# Patient Record
Sex: Female | Born: 1945 | Race: White | Hispanic: No | Marital: Married | Smoking: Former smoker
Health system: Southern US, Community
[De-identification: ages and names within clinical notes are randomized; demographics above are authoritative.]

## PROBLEM LIST (undated history)

## (undated) DIAGNOSIS — E7800 Pure hypercholesterolemia, unspecified: Secondary | ICD-10-CM

## (undated) DIAGNOSIS — Z973 Presence of spectacles and contact lenses: Secondary | ICD-10-CM

## (undated) DIAGNOSIS — I493 Ventricular premature depolarization: Secondary | ICD-10-CM

## (undated) DIAGNOSIS — M1990 Unspecified osteoarthritis, unspecified site: Secondary | ICD-10-CM

## (undated) DIAGNOSIS — I10 Essential (primary) hypertension: Secondary | ICD-10-CM

## (undated) DIAGNOSIS — R011 Cardiac murmur, unspecified: Secondary | ICD-10-CM

## (undated) DIAGNOSIS — C801 Malignant (primary) neoplasm, unspecified: Secondary | ICD-10-CM

## (undated) DIAGNOSIS — R112 Nausea with vomiting, unspecified: Secondary | ICD-10-CM

## (undated) DIAGNOSIS — E119 Type 2 diabetes mellitus without complications: Secondary | ICD-10-CM

## (undated) DIAGNOSIS — Z972 Presence of dental prosthetic device (complete) (partial): Secondary | ICD-10-CM

## (undated) DIAGNOSIS — H409 Unspecified glaucoma: Secondary | ICD-10-CM

## (undated) DIAGNOSIS — Z9889 Other specified postprocedural states: Secondary | ICD-10-CM

## (undated) DIAGNOSIS — E039 Hypothyroidism, unspecified: Secondary | ICD-10-CM

## (undated) HISTORY — PX: TONSILLECTOMY: SUR1361

## (undated) HISTORY — PX: APPENDECTOMY: SHX54

## (undated) HISTORY — PX: LAMINECTOMY: SHX219

## (undated) HISTORY — PX: CHOLECYSTECTOMY: SHX55

## (undated) HISTORY — PX: OTHER SURGICAL HISTORY: SHX169

## (undated) HISTORY — DX: Hypothyroidism, unspecified: E03.9

## (undated) HISTORY — PX: EYE SURGERY: SHX253

---

## 2000-11-30 ENCOUNTER — Encounter: Admission: RE | Admit: 2000-11-30 | Discharge: 2001-02-28 | Payer: Self-pay | Admitting: Family Medicine

## 2005-02-06 ENCOUNTER — Ambulatory Visit (HOSPITAL_COMMUNITY): Admission: RE | Admit: 2005-02-06 | Discharge: 2005-02-06 | Payer: Self-pay | Admitting: Family Medicine

## 2018-01-15 ENCOUNTER — Other Ambulatory Visit: Payer: Self-pay

## 2018-01-15 ENCOUNTER — Emergency Department (HOSPITAL_BASED_OUTPATIENT_CLINIC_OR_DEPARTMENT_OTHER)
Admission: EM | Admit: 2018-01-15 | Discharge: 2018-01-15 | Disposition: A | Discharge status: Home / Self Care | Payer: Medicare Other | Attending: Emergency Medicine | Admitting: Emergency Medicine

## 2018-01-15 ENCOUNTER — Emergency Department (HOSPITAL_BASED_OUTPATIENT_CLINIC_OR_DEPARTMENT_OTHER): Payer: Medicare Other

## 2018-01-15 ENCOUNTER — Encounter (HOSPITAL_BASED_OUTPATIENT_CLINIC_OR_DEPARTMENT_OTHER): Payer: Self-pay | Admitting: Emergency Medicine

## 2018-01-15 DIAGNOSIS — X509XXA Other and unspecified overexertion or strenuous movements or postures, initial encounter: Secondary | ICD-10-CM | POA: Insufficient documentation

## 2018-01-15 DIAGNOSIS — E119 Type 2 diabetes mellitus without complications: Secondary | ICD-10-CM | POA: Diagnosis not present

## 2018-01-15 DIAGNOSIS — Z79899 Other long term (current) drug therapy: Secondary | ICD-10-CM | POA: Diagnosis not present

## 2018-01-15 DIAGNOSIS — Z794 Long term (current) use of insulin: Secondary | ICD-10-CM | POA: Diagnosis not present

## 2018-01-15 DIAGNOSIS — Y9389 Activity, other specified: Secondary | ICD-10-CM | POA: Insufficient documentation

## 2018-01-15 DIAGNOSIS — Y9289 Other specified places as the place of occurrence of the external cause: Secondary | ICD-10-CM | POA: Diagnosis not present

## 2018-01-15 DIAGNOSIS — S60031A Contusion of right middle finger without damage to nail, initial encounter: Secondary | ICD-10-CM | POA: Diagnosis not present

## 2018-01-15 DIAGNOSIS — S6991XA Unspecified injury of right wrist, hand and finger(s), initial encounter: Secondary | ICD-10-CM | POA: Diagnosis present

## 2018-01-15 DIAGNOSIS — Y999 Unspecified external cause status: Secondary | ICD-10-CM | POA: Insufficient documentation

## 2018-01-15 DIAGNOSIS — I10 Essential (primary) hypertension: Secondary | ICD-10-CM | POA: Diagnosis not present

## 2018-01-15 HISTORY — DX: Essential (primary) hypertension: I10

## 2018-01-15 HISTORY — DX: Pure hypercholesterolemia, unspecified: E78.00

## 2018-01-15 HISTORY — DX: Type 2 diabetes mellitus without complications: E11.9

## 2018-01-15 IMAGING — CR DG HAND COMPLETE 3+V*R*
3 series · 3 of 3 positions shown · non-contrast
Comparison: None.

CLINICAL DATA: Hand injury, pain at third MCP joint, bruising and
swelling.

EXAM:
RIGHT HAND - COMPLETE 3+ VIEW

[x hand pa right]
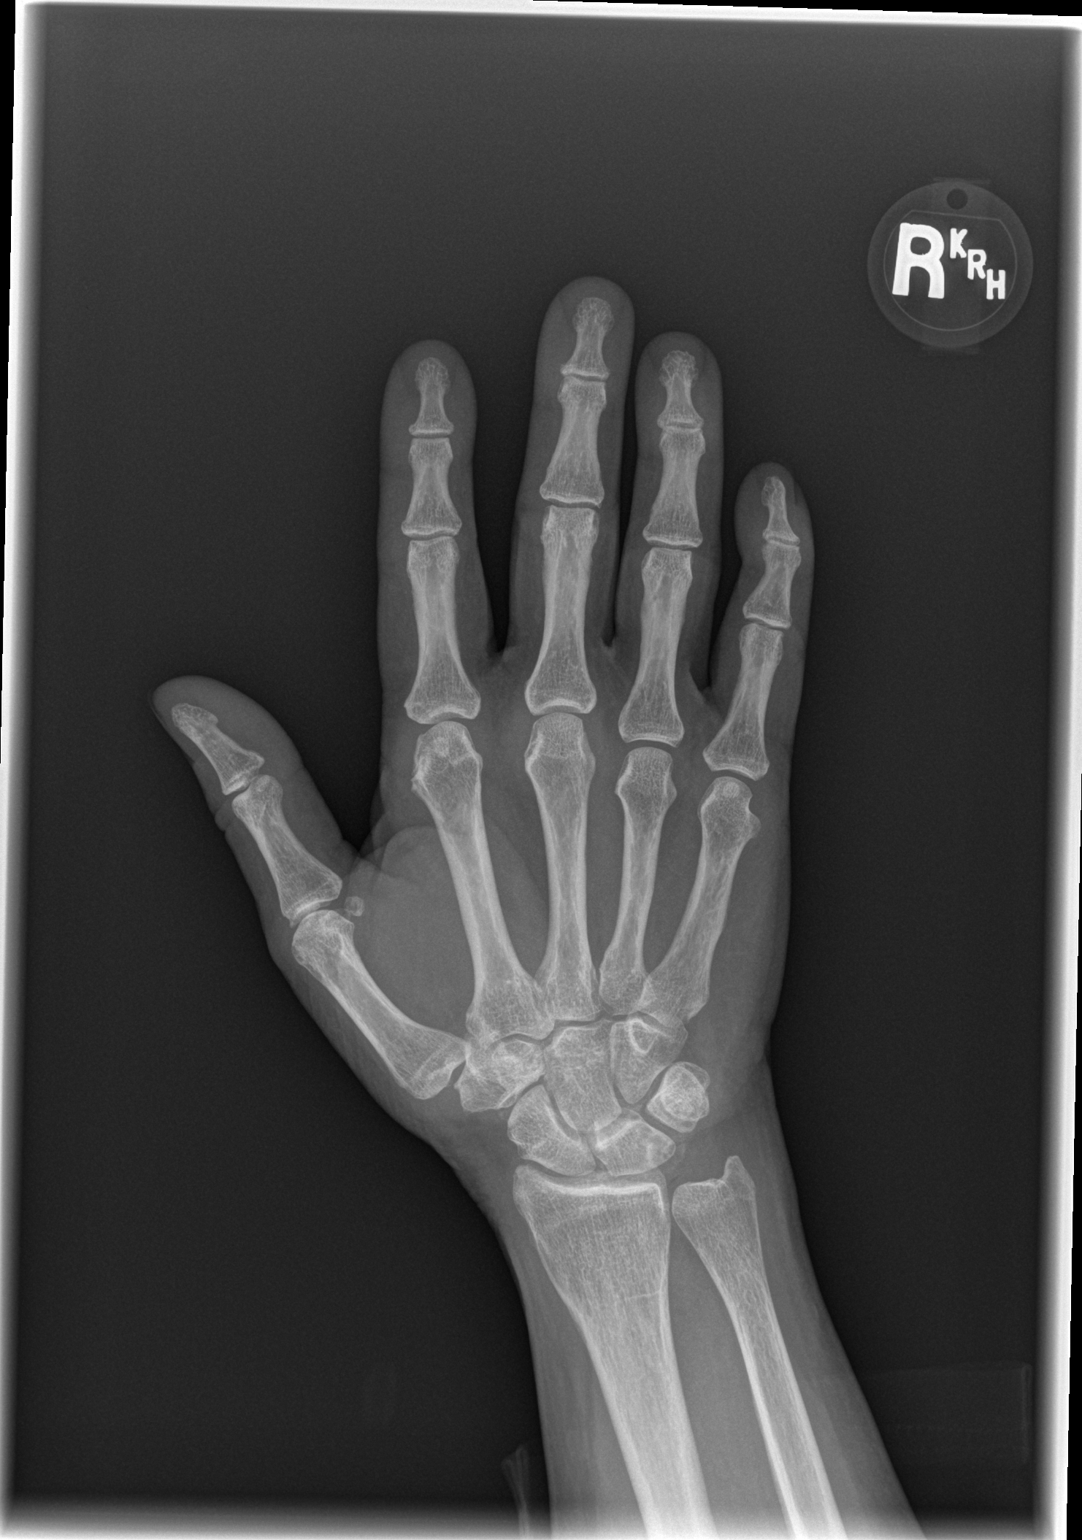

[x hand oblique right]
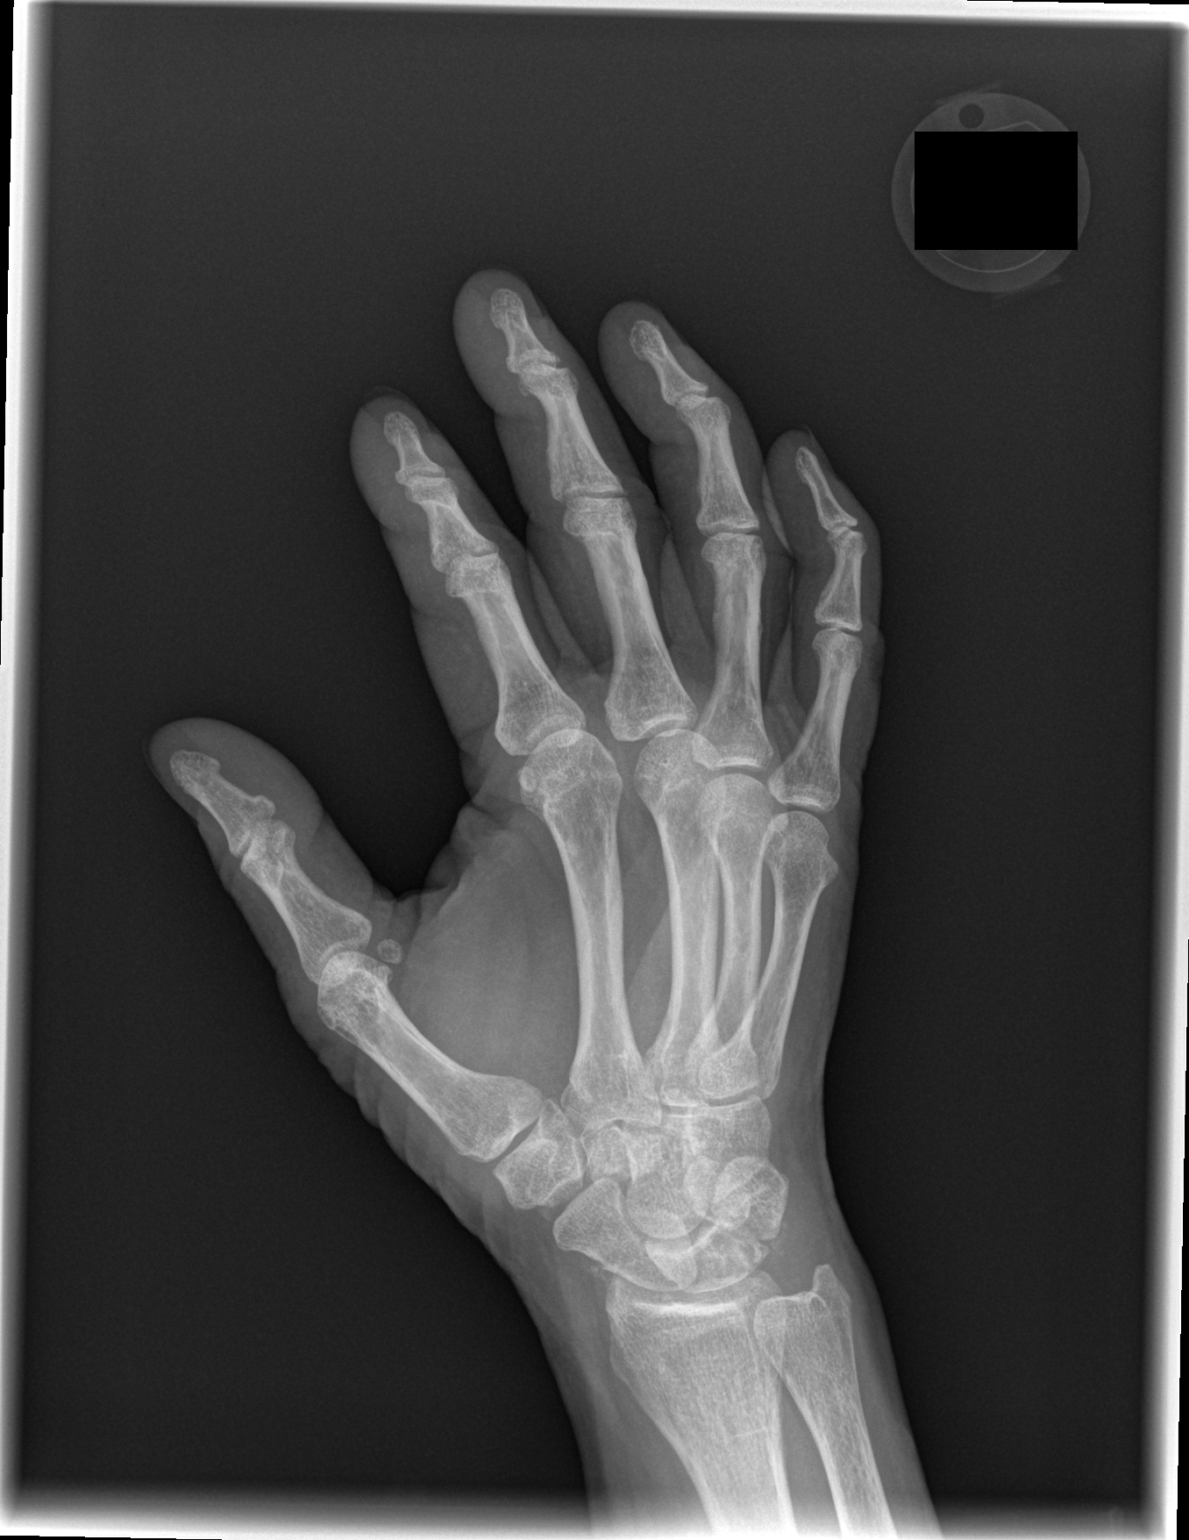

[x hand lat right]
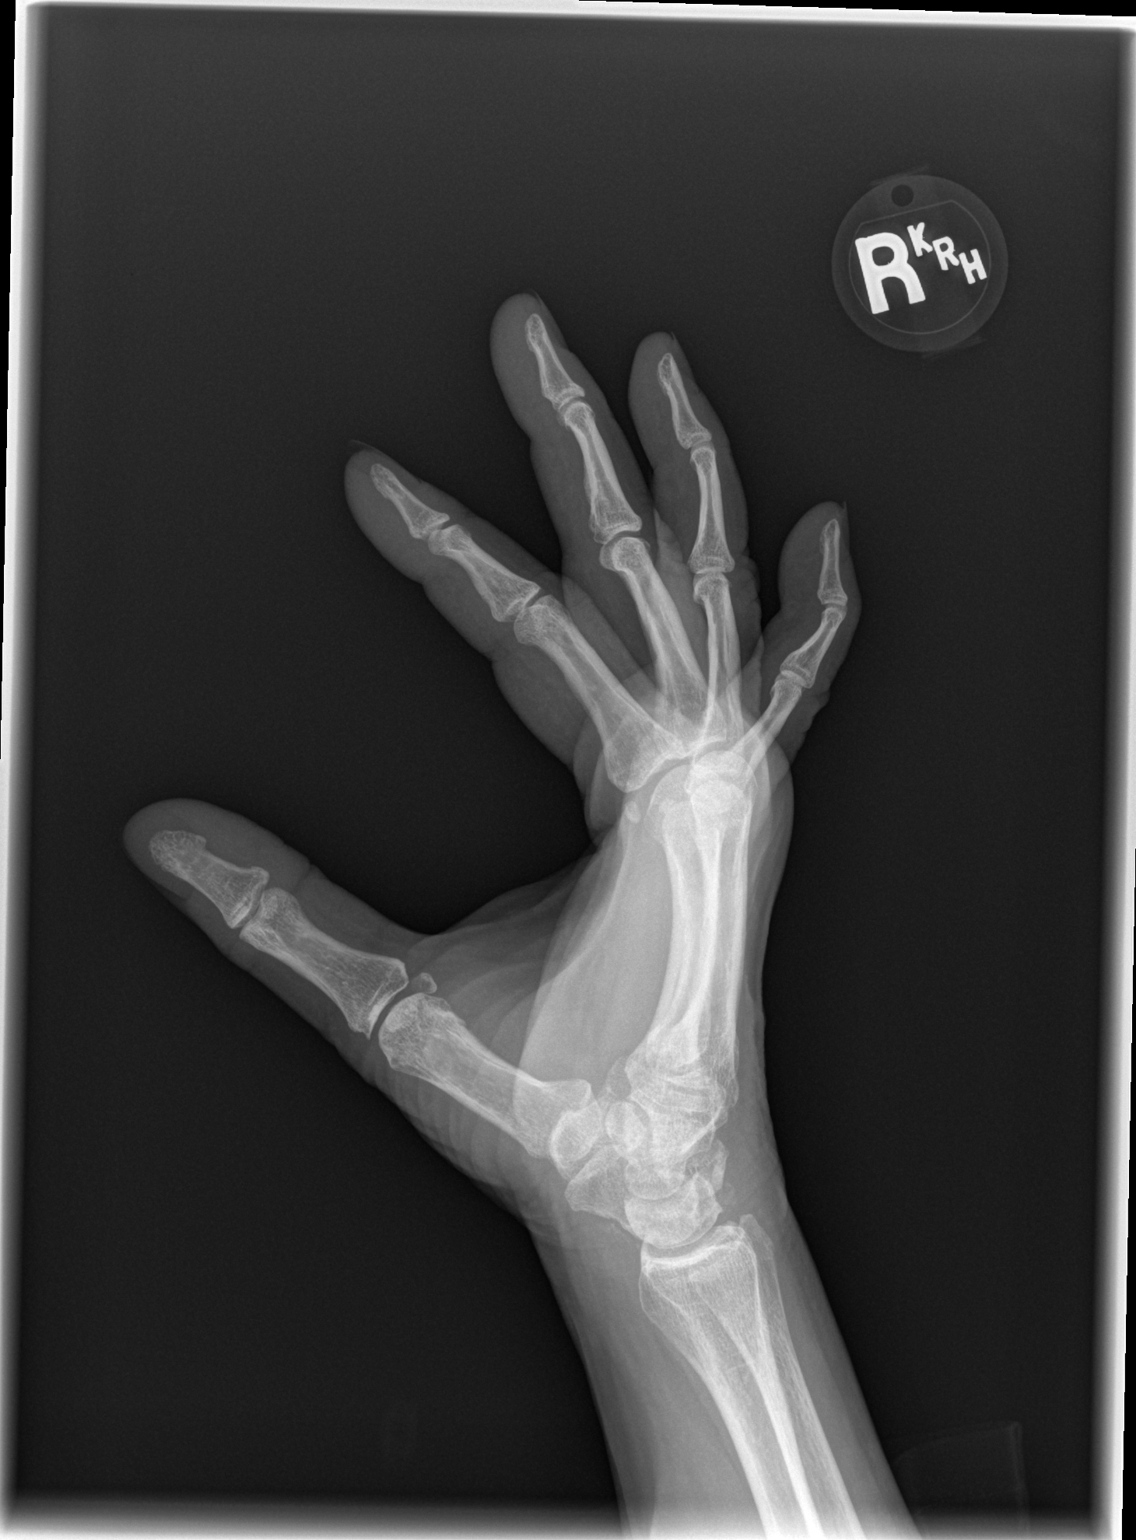

[3 of 3 positions shown; findings below may reference images not displayed]

FINDINGS: There is no evidence of fracture or dislocation. No acute or
suspicious osseous lesion. MCP and interphalangeal joints appear
well maintained. Soft tissues are unremarkable.
IMPRESSION: Negative.

## 2018-01-15 NOTE — Discharge Instructions (Signed)
Wear the splint pretty much all the time for the next few days except when bathing.  If not better in 1 week see the specialist

## 2018-01-15 NOTE — ED Triage Notes (Signed)
Pt reports that she was "flipping a stink bug off of something when she injured her right middle knuckle. Pt presents with bruising to right knuckle area.

## 2018-01-15 NOTE — ED Provider Notes (Signed)
[facility] Provider Note   CSN: [id number] Arrival date & time: 01/15/18  1532     History   Chief Complaint Chief Complaint  Patient presents with  . Hand Injury    HPI Laura Holland is a 72 y.o. female.  Patient is a 72 year old female with a history of hypertension, diabetes and hyperlipidemia presenting today with acute onset of right third finger pain.  She states she was going to flick a bug and is soon as it happened she had a sudden pain in her right third MCP joint that made it difficult for her to move her finger which then resulted in swelling and bruising of that knuckle.  She states she can now move it but it is still sore and it just does not feel quite right.  She denies hitting her hand on anything she has never had this problem before.  The history is provided by the patient.    Past Medical History:  Diagnosis Date  . Diabetes mellitus without complication ([place])   . High cholesterol   . Hypertension     There are no active problems to display for this patient.   Past Surgical History:  Procedure Laterality Date  . APPENDECTOMY    . CESAREAN SECTION    . CHOLECYSTECTOMY    . LAMINECTOMY       OB History   None      Home Medications    Prior to Admission medications   Medication Sig Start Date End Date Taking? Authorizing Provider  atorvastatin (LIPITOR) 40 MG tablet Take 40 mg by mouth daily.   Yes [provider]  canagliflozin (INVOKANA) 300 MG TABS tablet Take 300 mg by mouth daily before breakfast.   Yes [provider]  enalapril (VASOTEC) 2.5 MG tablet Take 2.5 mg by mouth daily.   Yes [provider]  Insulin Glargine-Lixisenatide (SOLIQUA) 100-33 UNT-MCG/ML SOPN Inject 35 Units into the skin daily.   Yes [provider]  levothyroxine (SYNTHROID, LEVOTHROID) 125 MCG tablet Take 125 mcg by mouth daily before breakfast.   Yes [provider]  metFORMIN  (GLUCOPHAGE) 1000 MG tablet Take 1,000 mg by mouth 2 (two) times daily with a meal.   Yes [provider]  metoprolol tartrate (LOPRESSOR) 25 MG tablet Take 25 mg by mouth 2 (two) times daily.   Yes [provider]    Family History No family history on file.  Social History Social History   Tobacco Use  . Smoking status: Never Smoker  . Smokeless tobacco: Never Used  Substance Use Topics  . Alcohol use: Never    Frequency: Never  . Drug use: Not on file     Allergies   Epinephrine; Novocain [procaine]; and Penicillins   Review of Systems Review of Systems  All other systems reviewed and are negative.    Physical Exam Updated Vital Signs BP (!) 141/62 (BP Location: Left Arm)   Pulse 71   Temp 98 F (36.7 C) (Oral)   Resp 18   Ht 5\' 7"  (1.702 m)   Wt 90.7 kg   SpO2 100%   BMI 31.32 kg/m   Physical Exam  Constitutional: She is oriented to person, place, and time. She appears well-developed and well-nourished. No distress.  HENT:  Head: Normocephalic and atraumatic.  Eyes: Pupils are equal, round, and reactive to light.  Strabismus of right eye  Cardiovascular: Normal rate.  Pulmonary/Chest: Effort normal.  Musculoskeletal:  Hands: Neurological: She is alert and oriented to person, place, and time.  Skin: Skin is warm and dry. Capillary refill takes less than 2 seconds.  Psychiatric: She has a normal mood and affect. Her behavior is normal.  Nursing note and vitals reviewed.    ED Treatments / Results  Labs (all labs ordered are listed, but only abnormal results are displayed) Labs Reviewed - No data to display  EKG None  Radiology Dg Hand Complete Right  Result Date: 01/15/2018 CLINICAL DATA:  Hand injury, pain at third MCP joint, bruising and swelling. EXAM: RIGHT HAND - COMPLETE 3+ VIEW COMPARISON:  None. FINDINGS: There is no evidence of fracture or dislocation. No acute or suspicious osseous lesion. MCP and  interphalangeal joints appear well maintained. Soft tissues are unremarkable. IMPRESSION: Negative. Electronically Signed   By: Franki Cabot M.D.   On: 01/15/2018 16:17    Procedures Procedures (including critical care time)  Medications Ordered in ED Medications - No data to display   Initial Impression / Assessment and Plan / ED Course  I have reviewed the triage vital signs and the nursing notes.  Pertinent labs & imaging results that were available during my care of the patient were reviewed by me and considered in my medical decision making (see chart for details).     Patient presenting today with swelling and ecchymosis to the third MCP.  She does not appear to have any ligamentous injuries.  Unsure if she had a subluxation of the joint with spontaneous reduction.  X-ray is within normal limits.  Finger splint was placed and patient instructed to ice and elevate.  Was given follow-up if it is not improved within 1 week.  Final Clinical Impressions(s) / ED Diagnoses   Final diagnoses:  Contusion of right middle finger without damage to nail, initial encounter    ED Discharge Orders    None       Blanchie Dessert, MD 01/15/18 1641

## 2018-01-31 ENCOUNTER — Ambulatory Visit (INDEPENDENT_AMBULATORY_CARE_PROVIDER_SITE_OTHER): Payer: Medicare Other | Admitting: Orthopaedic Surgery

## 2018-01-31 ENCOUNTER — Encounter (INDEPENDENT_AMBULATORY_CARE_PROVIDER_SITE_OTHER): Payer: Self-pay | Admitting: Orthopaedic Surgery

## 2018-01-31 DIAGNOSIS — M66241 Spontaneous rupture of extensor tendons, right hand: Secondary | ICD-10-CM

## 2018-01-31 NOTE — Progress Notes (Signed)
Office Visit Note   Patient: Laura Holland           Date of Birth: 06-09-1945           MRN: [id number] Visit Date: 01/31/2018              Requested by: Carol Ada, [address] [place], [place] [address] PCP: Carol Ada, MD   Assessment & Plan: Visit Diagnoses:  1. Sagittal band rupture, extensor tendon, nontraumatic, right     Plan: Impression is right middle finger radial sagittal band injury likely partial rupture.  Due to the swelling is difficult to see if her extensor tendon.  She does not present like she has a complete sagittal band rupture as she is able to actively extend her long finger from a flexed position.  I think she has a partial injury.  We will send her to hand therapy for extension splinting for 6 weeks.  We will see her back after 6 weeks to recheck her and to begin hand therapy.  Follow-Up Instructions: Return in about 6 weeks (around 03/14/2018).   Orders:  No orders of the defined types were placed in this encounter.  No orders of the defined types were placed in this encounter.     Procedures: No procedures performed   Clinical Data: No additional findings.   Subjective: Chief Complaint  Patient presents with  . Right Hand - Pain, Numbness  . Right Middle Finger - Pain    Laura Holland is a 72 year old female who comes in with acute right middle finger pain that she experienced when she tried to flick a bug with her middle finger.  This occurred on 01/15/2018.  X-rays were obtained and the diagnosis was contusion or joint subluxation by the ED provider.  She states that overall this is a little bit better and it is tender to touch with some occasional numbness.  There is swelling associated with the pain that is localized to the MCP joint.   Review of Systems  Constitutional: Negative.   HENT: Negative.   Eyes: Negative.   Respiratory: Negative.   Cardiovascular: Negative.   Endocrine: Negative.   Musculoskeletal:  Negative.   Neurological: Negative.   Hematological: Negative.   Psychiatric/Behavioral: Negative.   All other systems reviewed and are negative.    Objective: Vital Signs: There were no vitals taken for this visit.  Physical Exam  Constitutional: She is oriented to person, place, and time. She appears well-developed and well-nourished.  HENT:  Head: Normocephalic and atraumatic.  Eyes: EOM are normal.  Neck: Neck supple.  Pulmonary/Chest: Effort normal.  Abdominal: Soft.  Neurological: She is alert and oriented to person, place, and time.  Skin: Skin is warm. Capillary refill takes less than 2 seconds.  Psychiatric: She has a normal mood and affect. Her behavior is normal. Judgment and thought content normal.  Nursing note and vitals reviewed.   Ortho Exam Right hand exam shows swelling of the dorsal aspect of the third MCP joint.  MCP joint is stable to collateral testing.  She has mild weakness with active extension.  There is no active triggering.  She is tender over the sagittal bands more so on the radial side. Specialty Comments:  No specialty comments available.  Imaging: No results found.   PMFS History: Patient Active Problem List   Diagnosis Date Noted  . 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  Diabetes mellitus without complication ([place])   . High cholesterol   . Hypertension     History reviewed. No pertinent family history.  Past Surgical History:  Procedure Laterality Date  . APPENDECTOMY    . CESAREAN SECTION    . CHOLECYSTECTOMY    . LAMINECTOMY     Social History   Occupational History  . Not on file  Tobacco Use  . Smoking status: Never Smoker  . Smokeless tobacco: Never Used  Substance and Sexual Activity  . Alcohol use: Never    Frequency: Never  . Drug use: Not on file  . Sexual activity: Not on file

## 2018-03-08 HISTORY — PX: CATARACT EXTRACTION, BILATERAL: SHX1313

## 2018-03-14 ENCOUNTER — Encounter (INDEPENDENT_AMBULATORY_CARE_PROVIDER_SITE_OTHER): Payer: Self-pay | Admitting: Orthopaedic Surgery

## 2018-03-14 ENCOUNTER — Ambulatory Visit (INDEPENDENT_AMBULATORY_CARE_PROVIDER_SITE_OTHER): Payer: Medicare Other | Admitting: Orthopaedic Surgery

## 2018-03-14 DIAGNOSIS — M66241 Spontaneous rupture of extensor tendons, right hand: Secondary | ICD-10-CM | POA: Diagnosis not present

## 2018-03-14 NOTE — Progress Notes (Signed)
   Office Visit Note   Patient: Laura Holland           Date of Birth: Jul 24, 1945           MRN: [id number] Visit Date: 03/14/2018              Requested by: Carol Ada, [place], [place] [address] PCP: Carol Ada, MD   Assessment & Plan: Visit Diagnoses:  1. Sagittal band rupture, extensor tendon, nontraumatic, right     Plan: Impression is partial sagittal band rupture right long finger.  We will have the patient continue wearing her custom hand splint.  She will now begin hand therapy.  A prescription was provided to her today for this.  She will follow-up with [place] in 6 weeks time for recheck.  Follow-Up Instructions: Return in about 6 weeks (around 04/25/2018).   Orders:  No orders of the defined types were placed in this encounter.  No orders of the defined types were placed in this encounter.     Procedures: No procedures performed   Clinical Data: No additional findings.   Subjective: Chief Complaint  Patient presents with  . Right Middle Finger - Pain    HPI patient is a pleasant 73 year old female who presents to our clinic today approximately 8 weeks status post partial sagittal band rupture right long finger, date of injury 01/15/2018.  This was from flicking a bug.  She has been to hand therapy where she obtained a Thermoplast hand splint.  She has been wearing this over the past several weeks.  She really does not note much improvement of symptoms.  No worsening.  No fevers or chills.  Review of Systems as detailed in HPI.  All others reviewed and are negative.   Objective: Vital Signs: There were no vitals taken for this visit.  Physical Exam well-developed well-nourished female no acute distress.  Alert and oriented x3.  Ortho Exam examination of her right hand reveals moderate swelling and tenderness to the MCP joint.  She has near full flexion of the long finger.  She can actively extend a full finger as well.   She has some weakness with resisted extension.  She is neurovascularly intact distally.  Specialty Comments:  No specialty comments available.  Imaging: No new imaging   PMFS History: Patient Active Problem List   Diagnosis Date Noted  . 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 Diabetes mellitus without complication ([facility])   . High cholesterol   . Hypertension     History reviewed. No pertinent family history.  Past Surgical History:  Procedure Laterality Date  . APPENDECTOMY    . CESAREAN SECTION    . CHOLECYSTECTOMY    . LAMINECTOMY     Social History   Occupational History  . Not on file  Tobacco Use  . Smoking status: Never Smoker  . Smokeless tobacco: Never Used  Substance and Sexual Activity  . Alcohol use: Never    Frequency: Never  . Drug use: Not on file  . Sexual activity: Not on file

## 2018-03-16 ENCOUNTER — Telehealth (INDEPENDENT_AMBULATORY_CARE_PROVIDER_SITE_OTHER): Payer: Self-pay | Admitting: Orthopaedic Surgery

## 2018-03-16 NOTE — Telephone Encounter (Signed)
03/14/18 ov note faxed to [facility] [phone]

## 2018-04-25 ENCOUNTER — Encounter (INDEPENDENT_AMBULATORY_CARE_PROVIDER_SITE_OTHER): Payer: Self-pay | Admitting: Orthopaedic Surgery

## 2018-04-25 ENCOUNTER — Ambulatory Visit (INDEPENDENT_AMBULATORY_CARE_PROVIDER_SITE_OTHER): Payer: Medicare Other | Admitting: Orthopaedic Surgery

## 2018-04-25 VITALS — Ht 67.0 in | Wt 200.0 lb

## 2018-04-25 DIAGNOSIS — M66241 Spontaneous rupture of extensor tendons, right hand: Secondary | ICD-10-CM | POA: Diagnosis not present

## 2018-04-25 NOTE — Progress Notes (Signed)
Office Visit Note   Patient: Laura Holland           Date of Birth: 09-22-1945           MRN: [id number] Visit Date: 04/25/2018              Requested by: Carol Ada, [place], [place] [address] PCP: Carol Ada, MD   Assessment & Plan: Visit Diagnoses:  1. Sagittal band rupture, extensor tendon, nontraumatic, right     Plan: Impression is right long finger partial sagittal band rupture.  We will have the patient discontinue her thermoplastic splint.  She will continue with hand therapy and advance as tolerated.  I did offer a cortisone injection to the right long finger A1 pulley, but she politely declined.  She will call us back if she changes her mind.  Otherwise, follow-up with [place] in 4 weeks time for repeat evaluation.  Follow-Up Instructions: Return in about 4 weeks (around 05/23/2018).   Orders:  No orders of the defined types were placed in this encounter.  No orders of the defined types were placed in this encounter.     Procedures: No procedures performed   Clinical Data: No additional findings.   Subjective: Chief Complaint  Patient presents with  . Right Hand - Follow-up    HPI patient is a pleasant 73 year old female who presents our clinic today approximately 14 weeks status post probable right long finger sagittal band rupture from flicking a bug on 00/[id number]/8182.  She has been wearing a thermoplastic splint and has been in formal hand therapy over the past several weeks.  She notes that this is helping with the stiffness and pain, but still not fully functional as she is unable to pick things up and turn knobs.    Review of Systems as detailed in HPI.  All others reviewed and are negative.   Objective: Vital Signs: Ht 5\' 7"  (1.702 m)   Wt 200 lb (90.7 kg)   BMI 31.32 kg/m   Physical Exam well-developed and well-nourished female in no acute distress.  Alert and oriented x3.  Ortho Exam examination of the right  hand reveals moderate tenderness over the MCP joint as well as A1 pulley right long finger.  She does have a slightly tender palpable nodule at that area.  No active triggering.  She has full extension without subluxation of the extensor tendon.  She is almost able to make a full fist.  She has moderate swelling which I think is contributing to her inability to fully flex all 5 fingers.  She is neurovascularly intact distally.  Specialty Comments:  No specialty comments available.  Imaging: No new imaging   PMFS History: Patient Active Problem List   Diagnosis Date Noted  . 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 Diabetes mellitus without complication ([place])   . High cholesterol   . Hypertension     No family history on file.  Past Surgical History:  Procedure Laterality Date  . APPENDECTOMY    . CESAREAN SECTION    . CHOLECYSTECTOMY    . LAMINECTOMY     Social History   Occupational History  . Not on file  Tobacco Use  . Smoking status: Never Smoker  . Smokeless tobacco: Never Used  Substance and Sexual Activity  . Alcohol use: Never    Frequency: Never  . Drug use: Not on file  .  Sexual activity: Not on file

## 2018-05-23 ENCOUNTER — Other Ambulatory Visit: Payer: Self-pay

## 2018-05-23 ENCOUNTER — Encounter (INDEPENDENT_AMBULATORY_CARE_PROVIDER_SITE_OTHER): Payer: Self-pay | Admitting: Orthopaedic Surgery

## 2018-05-23 ENCOUNTER — Ambulatory Visit (INDEPENDENT_AMBULATORY_CARE_PROVIDER_SITE_OTHER): Payer: Medicare Other | Admitting: Orthopaedic Surgery

## 2018-05-23 VITALS — Ht 67.0 in | Wt 200.0 lb

## 2018-05-23 DIAGNOSIS — M66241 Spontaneous rupture of extensor tendons, right hand: Secondary | ICD-10-CM | POA: Diagnosis not present

## 2018-05-23 NOTE — Progress Notes (Signed)
Patient ID: Laura Holland, female   DOB: 22-Feb-1946, 73 y.o.   MRN: [id number]  Laura Holland follows up today for her finger injury to her sagittal band.  She feels that she still has some mild discomfort and some stiffness when she grasps objects.  She has full range of motion of the MP joint at this point.  She is not taking any medicines for this.  Physical exam shows mild swelling of the MP joint of the middle finger.  She has normal passive and active range of motion of the MP joint.  At this point she has regained most of her hand function.  I think most of her complaints comes from the persistent swelling over the MP joint but this does not cause any deficits.  I would continue to recommend icing and I would expect this to be self-limiting and fully resolved with time.  She may discontinue hand therapy and just do home exercise at this point.  Questions encouraged and answered.

## 2019-05-21 ENCOUNTER — Other Ambulatory Visit: Payer: Self-pay | Admitting: Endocrinology

## 2019-05-21 DIAGNOSIS — E039 Hypothyroidism, unspecified: Secondary | ICD-10-CM

## 2019-05-21 DIAGNOSIS — E049 Nontoxic goiter, unspecified: Secondary | ICD-10-CM

## 2019-06-01 ENCOUNTER — Ambulatory Visit
Admission: RE | Admit: 2019-06-01 | Discharge: 2019-06-01 | Disposition: A | Discharge status: Home / Self Care | Payer: Medicare Other | Source: Ambulatory Visit | Attending: Endocrinology | Admitting: Endocrinology

## 2019-06-01 DIAGNOSIS — E049 Nontoxic goiter, unspecified: Secondary | ICD-10-CM

## 2019-06-01 DIAGNOSIS — E039 Hypothyroidism, unspecified: Secondary | ICD-10-CM

## 2019-06-01 IMAGING — US US THYROID
1 series · 14 of 25 positions shown · non-contrast
Comparison: None.

CLINICAL DATA: Hypothyroidism

EXAM:
THYROID ULTRASOUND
TECHNIQUE: Ultrasound examination of the thyroid gland and adjacent soft
tissues was performed.

[Series 1: us thyroid · 0.05mm/px · 14 of 60 slices shown]
[im 1/60]
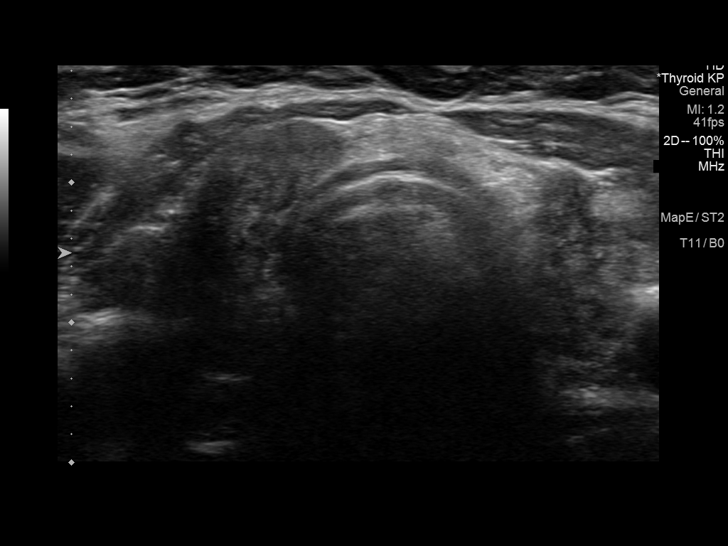
[im 5/60]
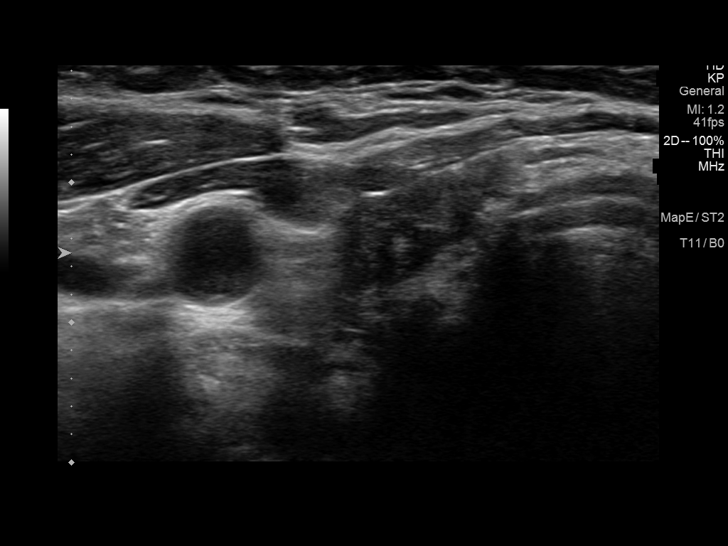
[im 10/60]
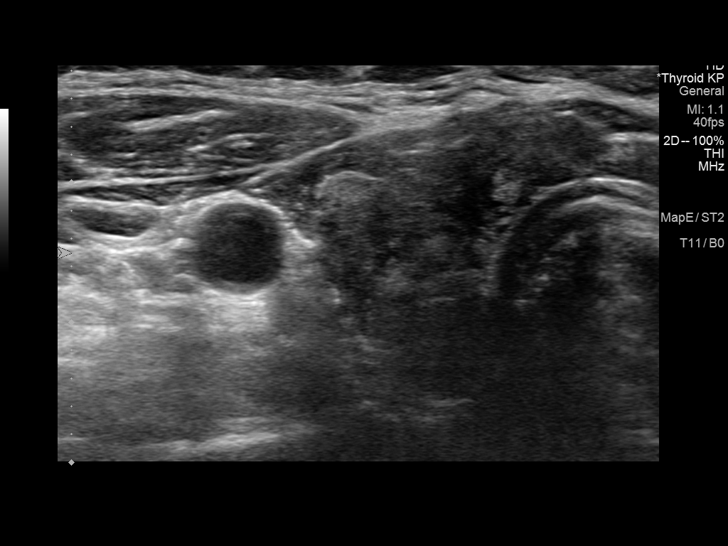
[im 15/60]
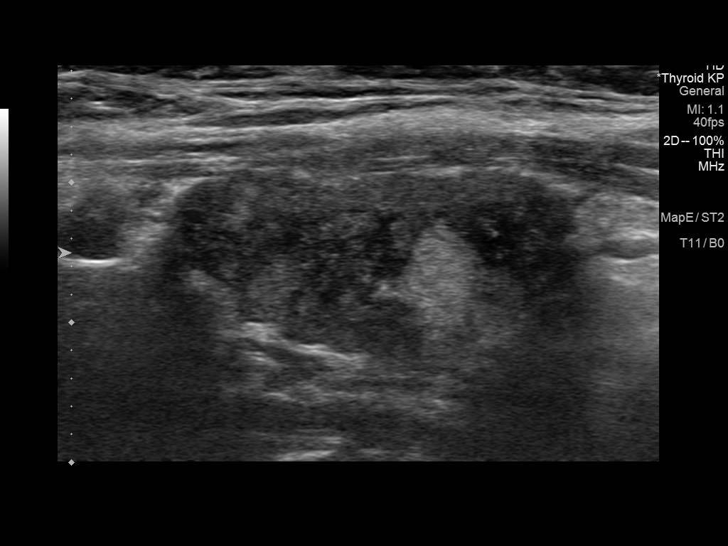
[im 20/60]
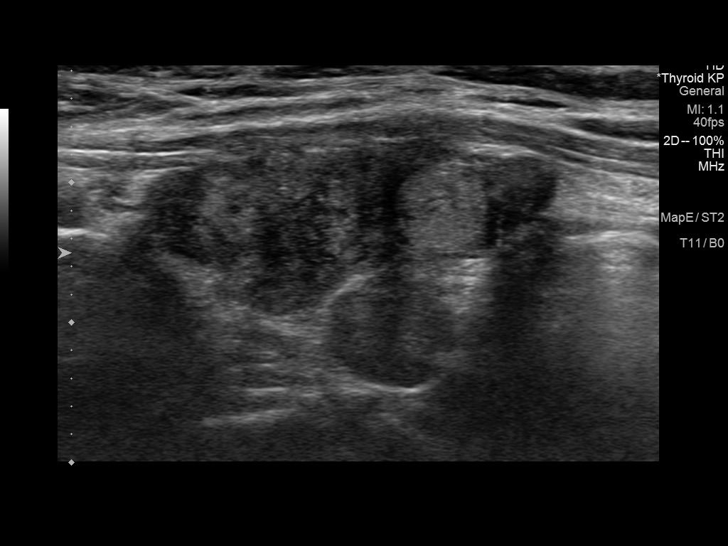
[im 23/60]
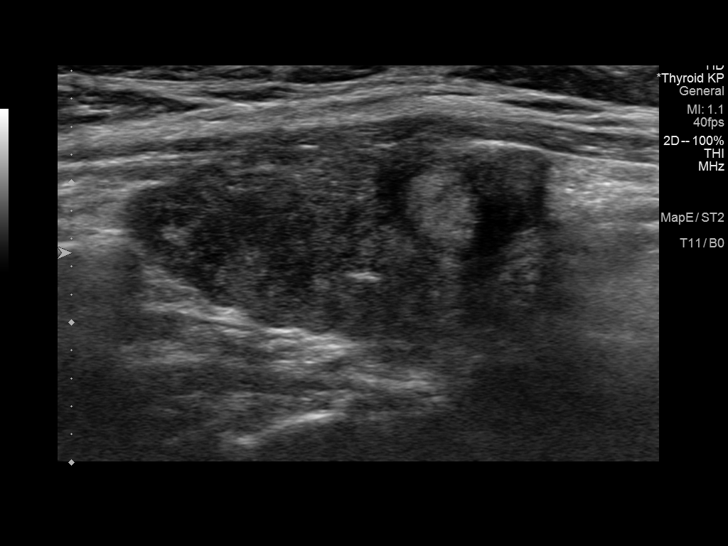
[im 28/60]
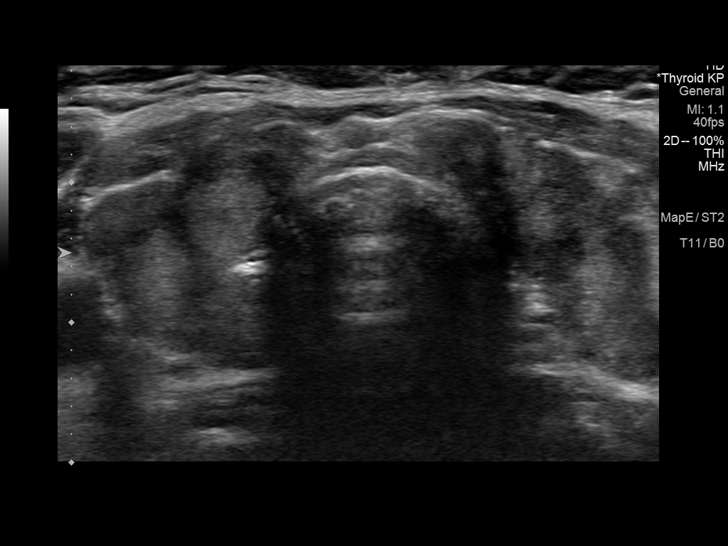
[im 32/60]
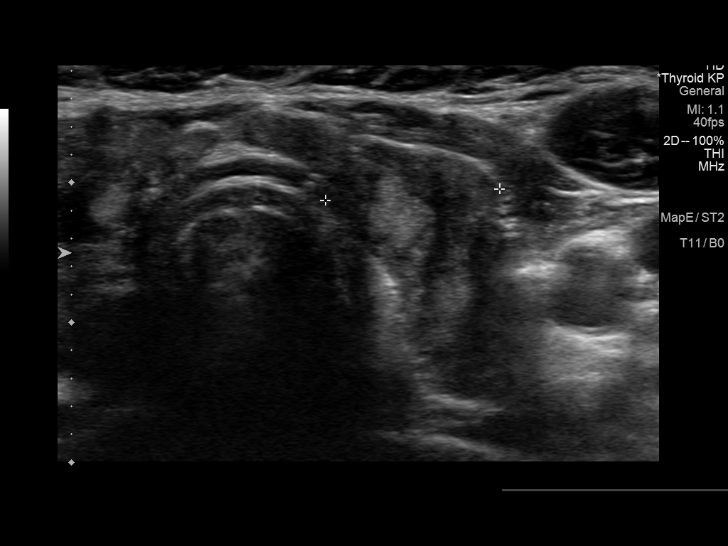
[im 37/60]
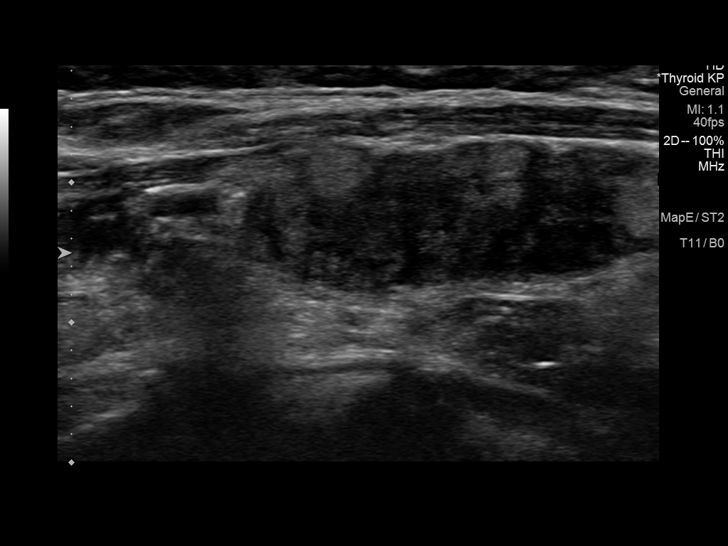
[im 40/60]
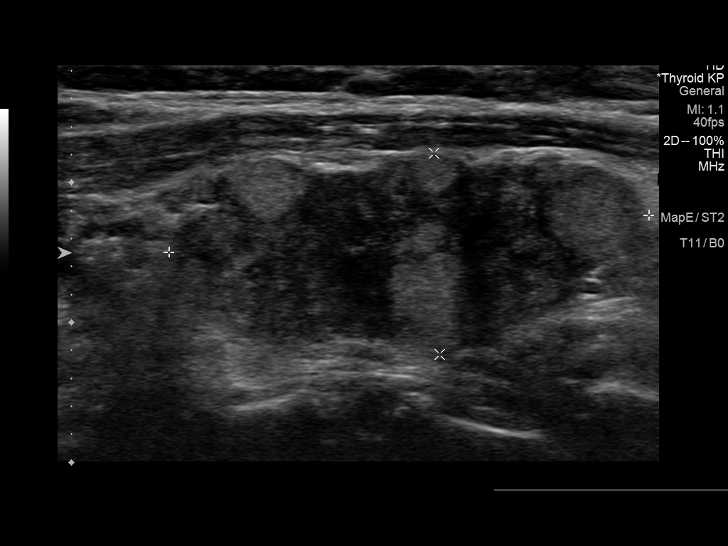
[im 45/60]
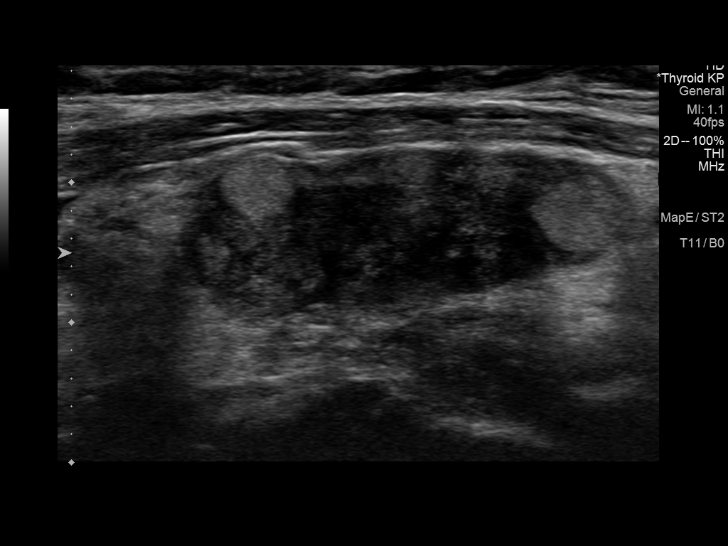
[im 50/60]
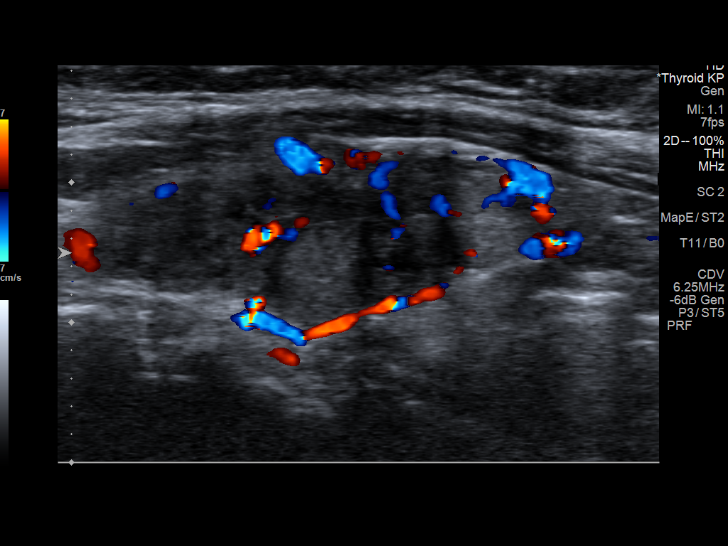
[im 55/60]
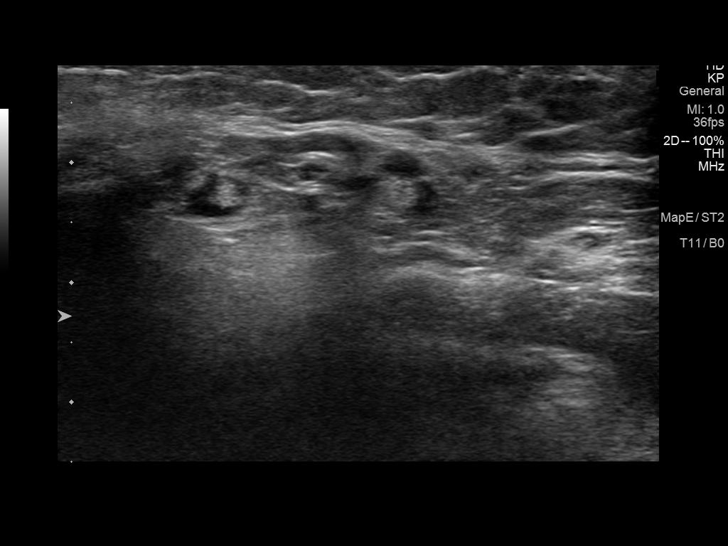
[im 60/60]
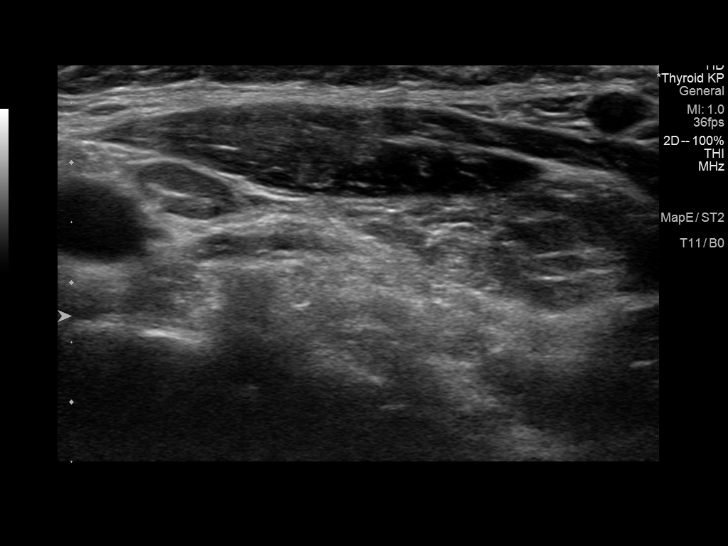

[14 of 25 positions shown; findings below may reference images not displayed]

FINDINGS: Parenchymal Echotexture: Markedly heterogenous. The thyroid gland is
hypervascular.

Isthmus: 0.3 cm

Right lobe: 3 x 1.7 x 1.4 cm

Left lobe: 3.4 x 1.4 x 1.3 cm

_________________________________________________________

Estimated total number of nodules >/= 1 cm: 0

Number of spongiform nodules >/=  2 cm not described below (TR1): 0

Number of mixed cystic and solid nodules >/= 1.5 cm not described
below (TR2): 0

_________________________________________________________

No discrete nodules are seen within the thyroid gland.
IMPRESSION: Small, markedly heterogeneous thyroid gland without evidence for
distinct thyroid nodule. Findings are nonspecific but can be seen in
patients with Hashimoto's thyroiditis or Graves disease.

The above is in keeping with the ACR TI-RADS recommendations - [HOSPITAL] [CN];[DATE].

## 2019-06-13 ENCOUNTER — Ambulatory Visit: Payer: Medicare Other | Admitting: Cardiology

## 2019-06-13 ENCOUNTER — Other Ambulatory Visit: Payer: Self-pay

## 2019-06-13 ENCOUNTER — Encounter: Payer: Self-pay | Admitting: Cardiology

## 2019-06-13 VITALS — BP 140/70 | HR 65 | Temp 98.6°F | Resp 14 | Ht 67.0 in | Wt 205.0 lb

## 2019-06-13 DIAGNOSIS — E1140 Type 2 diabetes mellitus with diabetic neuropathy, unspecified: Secondary | ICD-10-CM

## 2019-06-13 DIAGNOSIS — Z794 Long term (current) use of insulin: Secondary | ICD-10-CM

## 2019-06-13 DIAGNOSIS — R072 Precordial pain: Secondary | ICD-10-CM

## 2019-06-13 DIAGNOSIS — E6609 Other obesity due to excess calories: Secondary | ICD-10-CM

## 2019-06-13 DIAGNOSIS — E66811 Obesity, class 1: Secondary | ICD-10-CM

## 2019-06-13 DIAGNOSIS — I493 Ventricular premature depolarization: Secondary | ICD-10-CM

## 2019-06-13 DIAGNOSIS — R002 Palpitations: Secondary | ICD-10-CM

## 2019-06-13 DIAGNOSIS — I10 Essential (primary) hypertension: Secondary | ICD-10-CM

## 2019-06-13 DIAGNOSIS — Z87891 Personal history of nicotine dependence: Secondary | ICD-10-CM

## 2019-06-13 DIAGNOSIS — E782 Mixed hyperlipidemia: Secondary | ICD-10-CM

## 2019-06-13 NOTE — Patient Instructions (Signed)
Please remember to bring in your medication bottles in at the next visit.   Office will call you to have the following tests scheduled:  Echocardiogram. Stress test. Monitor  Recommend follow up with your PCP as scheduled.

## 2019-06-13 NOTE — Progress Notes (Signed)
REASON FOR CONSULT: Palpitations and PVCs  Chief Complaint  Patient presents with  . New Patient (Initial Visit)  . Palpitations    REQUESTING PHYSICIAN:  Carol Ada, [address] [place],  [place] [id number]  HPI  Laura Holland is a 74 y.o. female who presents to the office with a chief complaint of " palpitations." Patient's past medical history and cardiac risk factors include: Insulin-dependent diabetes mellitus type 2, hypertension, hyperlipidemia, postmenopausal female, advanced age, former smoker, obesity.  Patient is accompanied by her husband at today's office visit.  Patient is referred to the office at the request of her primary care provider for evaluation of palpitations and PVCs.  Palpitations: Patient states that the palpitations have been going on for at least 1 month in duration.  She notices that she has been having extra heartbeats and this has been increasing in frequency.  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  However recently she has noticed increased palpitations which she thinks is more PVCs.  She addressed this to her primary care provider and she sent to cardiology for further evaluation.  Patient states that these episodes happen on a daily basis, more prominent during nighttime hours, last for few seconds to minutes, usually self-limited.  She does not have any associated symptoms of lightheadedness, dizziness, near syncope or syncope.  No worsening factors.  Patient states that she decrease her caffeine intake which have helped her symptoms overall but not resolved.  Patient denies any chest pain at rest or with effort related activities.  Chest pain: Patient states that she has chest pains at times.  They are located substernally, not brought on by effort related activities nor improving with rest.  They are usually self-limited.  Patient states that the chest discomfort may be  also component of her osteoarthritis as well.  She does not complain of any orthopnea or paroxysmal nocturnal dyspnea and she has mild swelling in bilateral lower extremity which improves in the morning.  No significant cardiac work-up performed in the past.  Review of systems positive for: chest pain, palpitations, lower extremity swelling. Currently patient denies shortness of breath at rest or effort related symptoms, lightheadedness, dizziness, orthopnea, paroxysmal nocturnal dyspnea,near syncope, syncopal events, hematochezia, hemoptysis, hematemesis, melanotic stools, no symptoms of amaurosis fugax, motor or sensory symptoms or dysphasia in the last 6 months.   Denies prior history of coronary artery disease, myocardial infarction, congestive heart failure, deep venous thrombosis, pulmonary embolism, stroke, transient ischemic attack.  ALLERGIES: Allergies  Allergen Reactions  . Epinephrine   . Novocain [Procaine]   . Penicillins    MEDICATION LIST PRIOR TO VISIT: Current Outpatient Medications on File Prior to Visit  Medication Sig Dispense Refill  . atorvastatin (LIPITOR) 80 MG tablet Take 40 mg by mouth daily.    . Calcium Carb-Cholecalciferol 600-800 MG-UNIT CHEW Chew 600 mg by mouth daily.    . canagliflozin (INVOKANA) 300 MG TABS tablet Take 300 mg by mouth daily before breakfast.    . Dulaglutide (TRULICITY) 3.41 DQ/2.2WL SOPN Inject 0.75 mLs into the skin once a week.    . enalapril (VASOTEC) 2.5 MG tablet Take 2.5 mg by mouth daily.    . furosemide (LASIX) 20 MG tablet Take 20 mg by mouth daily.    . Insulin Glargine-Lixisenatide (SOLIQUA) 100-33 UNT-MCG/ML SOPN Inject 35 Units into the skin daily.    . Insulin Lispro Prot & Lispro (HUMALOG  MIX 75/25 KWIKPEN) (75-25) 100 UNIT/ML Kwikpen As directed.    Marland Kitchen levothyroxine (SYNTHROID, LEVOTHROID) 125 MCG tablet Take 125 mcg by mouth daily before breakfast.    . loratadine (CLARITIN) 10 MG tablet Take 10 mg by mouth as needed.      . metFORMIN (GLUCOPHAGE) 1000 MG tablet Take 1,000 mg by mouth 2 (two) times daily with a meal.    . metoprolol tartrate (LOPRESSOR) 25 MG tablet Take 25 mg by mouth once.      No current facility-administered medications on file prior to visit.    PAST MEDICAL HISTORY: Past Medical History:  Diagnosis Date  . Diabetes mellitus without complication ([place])   . High cholesterol   . Hypertension   . Hypothyroid     PAST SURGICAL HISTORY: Past Surgical History:  Procedure Laterality Date  . APPENDECTOMY    . CATARACT EXTRACTION, BILATERAL  2020  . CESAREAN SECTION    . CHOLECYSTECTOMY    . LAMINECTOMY      FAMILY HISTORY: The patient family history includes Arthritis in her mother; Breast cancer in her mother and sister; Heart attack in her sister; Hyperlipidemia in her brother and father; Hypertension in her brother and father; Lung cancer in her father and sister; Thyroid cancer in her mother.   SOCIAL HISTORY:  The patient  reports that she quit smoking about 56 years ago. Her smoking use included cigarettes. She has never used smokeless tobacco. She reports previous alcohol use. She reports that she does not use drugs.  REVIEW OF SYSTEMS: Review of Systems  Constitution: Negative for chills and fever.  HENT: Negative for ear discharge, ear pain and nosebleeds.   Eyes: Negative for blurred vision and discharge.  Cardiovascular: Positive for chest pain, leg swelling and palpitations. Negative for claudication, dyspnea on exertion, near-syncope, orthopnea, paroxysmal nocturnal dyspnea and syncope.  Respiratory: Negative for cough and shortness of breath.   Endocrine: Negative for polydipsia, polyphagia and polyuria.  Hematologic/Lymphatic: Negative for bleeding problem.  Skin: Negative for flushing and nail changes.  Musculoskeletal: Negative for muscle cramps, muscle weakness and myalgias.  Gastrointestinal: Negative for abdominal pain, dysphagia, hematemesis, hematochezia,  melena, nausea and vomiting.  Neurological: Negative for dizziness, focal weakness and light-headedness.   PHYSICAL EXAM: Vitals with BMI 06/13/2019 05/23/2018 04/25/2018  Height '5\' 7"'  '5\' 7"'  '5\' 7"'   Weight 205 lbs 200 lbs 200 lbs  BMI 32.1 03.21 22.48  Systolic 250 - -  Diastolic 70 - -  Pulse 65 - -   CONSTITUTIONAL: Well-developed and well-nourished. No acute distress.  SKIN: Skin is warm and dry. No rash noted. No cyanosis. No pallor. No jaundice HEAD: Normocephalic and atraumatic.  EYES: No scleral icterus MOUTH/THROAT: Moist oral membranes.  NECK: No JVD present. No thyromegaly noted. No carotid bruits  LYMPHATIC: No visible cervical adenopathy.  CHEST Normal respiratory effort. No intercostal retractions  LUNGS: Clear to auscultation bilaterally.  No stridor. No wheezes. No rales.  CARDIOVASCULAR: Regular rate and rhythm, positive S1-S2, no murmurs rubs or gallops appreciated. ABDOMINAL: Obese, soft, nontender, nondistended, positive bowel sounds in all 4 quadrants.  No apparent ascites.  EXTREMITIES: Trace peripheral edema, warm to touch bilaterally. HEMATOLOGIC: No significant bruising NEUROLOGIC: Oriented to person, place, and time. Nonfocal. Normal muscle tone.  PSYCHIATRIC: Normal mood and affect. Normal behavior. Cooperative  CARDIAC DATABASE: EKG: 06/13/2019: Normal sinus rhythm, ventricular rate 69 bpm, incomplete right bundle branch block, poor R wave progression, no evidence of underlying ischemia or myocardial injury pattern.  Echocardiogram: None  Stress Testing: None  Heart Catheterization: None  LABORATORY DATA: External Labs: Collected: May 28, 2019 Creatinine 0.71 mg/dL. eGFR: 81 mL/min per 1.73 m TSH: 1.31  FINAL MEDICATION LIST END OF ENCOUNTER: No orders of the defined types were placed in this encounter.   Medications Discontinued During This Encounter  Medication Reason  . atorvastatin (LIPITOR) 40 MG tablet Duplicate     Current  Outpatient Medications:  .  atorvastatin (LIPITOR) 80 MG tablet, Take 40 mg by mouth daily., Disp: , Rfl:  .  Calcium Carb-Cholecalciferol 600-800 MG-UNIT CHEW, Chew 600 mg by mouth daily., Disp: , Rfl:  .  canagliflozin (INVOKANA) 300 MG TABS tablet, Take 300 mg by mouth daily before breakfast., Disp: , Rfl:  .  Dulaglutide (TRULICITY) 2.95 AO/1.3YQ SOPN, Inject 0.75 mLs into the skin once a week., Disp: , Rfl:  .  enalapril (VASOTEC) 2.5 MG tablet, Take 2.5 mg by mouth daily., Disp: , Rfl:  .  furosemide (LASIX) 20 MG tablet, Take 20 mg by mouth daily., Disp: , Rfl:  .  Insulin Glargine-Lixisenatide (SOLIQUA) 100-33 UNT-MCG/ML SOPN, Inject 35 Units into the skin daily., Disp: , Rfl:  .  Insulin Lispro Prot & Lispro (HUMALOG MIX 75/25 KWIKPEN) (75-25) 100 UNIT/ML Kwikpen, As directed., Disp: , Rfl:  .  levothyroxine (SYNTHROID, LEVOTHROID) 125 MCG tablet, Take 125 mcg by mouth daily before breakfast., Disp: , Rfl:  .  loratadine (CLARITIN) 10 MG tablet, Take 10 mg by mouth as needed., Disp: , Rfl:  .  metFORMIN (GLUCOPHAGE) 1000 MG tablet, Take 1,000 mg by mouth 2 (two) times daily with a meal., Disp: , Rfl:  .  metoprolol tartrate (LOPRESSOR) 25 MG tablet, Take 25 mg by mouth once. , Disp: , Rfl:   IMPRESSION:    ICD-10-CM   1. Palpitations  R00.2 HOLTER MONITOR - 24 HOUR    PCV ECHOCARDIOGRAM COMPLETE  2. PVC (premature ventricular contraction)  I49.3 EKG 12-Lead    HOLTER MONITOR - 24 HOUR    PCV ECHOCARDIOGRAM COMPLETE    PCV MYOCARDIAL PERFUSION WITH LEXISCAN  3. Precordial chest pain  R07.2 PCV MYOCARDIAL PERFUSION WITH LEXISCAN  4. Benign hypertension  I10   5. Mixed hyperlipidemia  E78.2   6. Type 2 diabetes mellitus with diabetic neuropathy, with long-term current use of insulin (HCC)  E11.40 PCV MYOCARDIAL PERFUSION WITH LEXISCAN   Z79.4   7. Long-term insulin use (HCC)  Z79.4 PCV MYOCARDIAL PERFUSION WITH LEXISCAN  8. Former smoker  Z87.891   64. Class 1 obesity due to excess  calories with serious comorbidity and body mass index (BMI) of 32.0 to 32.9 in adult  E66.09    Z68.32      RECOMMENDATIONS: MEGANN EASTERWOOD is a 74 y.o. female whose past medical history and cardiac risk factors include: Insulin-dependent diabetes mellitus type 2, hypertension, hyperlipidemia, postmenopausal female, advanced age, former smoker, obesity.  Palpitations:  Recommended 24-hour Holter to evaluate PVC burden.  If the monitoring time is not enough we may consider prolonging it at the next office visit.  Continue beta-blocker therapy.  TSH level was recently checked by his primary provider and reviewed.  Echocardiogram will be ordered to evaluate for structural heart disease and left ventricular systolic function.  Precordial chest pain:  Patient's chest pain is not suggestive of typical anginal symptoms.  However patient does have multiple vascular risk factors including insulin-dependent diabetes mellitus type 2 and would recommend an ischemic evaluation.  EKG performed, interpreted, and noted above.  Echocardiogram  will be ordered to evaluate for structural heart disease and left ventricular systolic function.  Nuclear stress test recommended to evaluate for reversible ischemia.  Insulin-dependent diabetes mellitus type 2: Management currently  Mixed hyperlipidemia . Continue statin therapy.   . Follow lipids. . Currently managed by primary care provider. . Patient denies myalgia or other side effects.  Former smoker: Educated on the importance of continued smoking cessation.  Orders Placed This Encounter  Procedures  . HOLTER MONITOR - 24 HOUR  . PCV MYOCARDIAL PERFUSION WITH LEXISCAN  . EKG 12-Lead  . PCV ECHOCARDIOGRAM COMPLETE   --Continue cardiac medications as reconciled in final medication list. --Return in about 6 weeks (around 07/25/2019). Or sooner if needed. --Continue follow-up with your primary care physician regarding the management of your other  chronic comorbid conditions.  Patient's questions and concerns were addressed to her and her husband's satisfaction. She voices understanding of the instructions provided during this encounter.   During this visit I reviewed and updated: Tobacco history  allergies medication reconciliation  medical history  surgical history  family history  social history.  This note was created using a voice recognition software as a result there may be grammatical errors inadvertently enclosed that do not reflect the nature of this encounter. Every attempt is made to correct such errors.  Rex Kras, [place], [facility]  Pager: [phone] Office: [phone]

## 2019-06-19 ENCOUNTER — Other Ambulatory Visit: Payer: Self-pay

## 2019-06-19 ENCOUNTER — Ambulatory Visit: Payer: Medicare Other

## 2019-06-19 DIAGNOSIS — R002 Palpitations: Secondary | ICD-10-CM

## 2019-06-19 DIAGNOSIS — I493 Ventricular premature depolarization: Secondary | ICD-10-CM

## 2019-06-27 ENCOUNTER — Ambulatory Visit: Payer: Medicare Other

## 2019-06-27 ENCOUNTER — Other Ambulatory Visit: Payer: Self-pay

## 2019-06-27 DIAGNOSIS — I493 Ventricular premature depolarization: Secondary | ICD-10-CM

## 2019-06-27 DIAGNOSIS — R002 Palpitations: Secondary | ICD-10-CM

## 2019-06-27 DIAGNOSIS — E1140 Type 2 diabetes mellitus with diabetic neuropathy, unspecified: Secondary | ICD-10-CM

## 2019-06-27 DIAGNOSIS — Z794 Long term (current) use of insulin: Secondary | ICD-10-CM

## 2019-06-27 DIAGNOSIS — R072 Precordial pain: Secondary | ICD-10-CM

## 2019-07-03 NOTE — Progress Notes (Signed)
Relayed information to patient. Patient voiced understanding.  

## 2019-07-29 NOTE — Progress Notes (Signed)
Laura Holland Date of Birth: 1945-11-30 MRN: [id number] Primary Care Provider:Smith, Hal Hope, MD Primary Cardiologist: Rex Kras, DO (established care 06/13/2019)  Date: 07/30/19 Last Office Visit: 06/13/2019  Chief Complaint  Patient presents with  . Palpitations  . Results    HPI  Laura Holland is a 74 y.o. female who presents to the office with a chief complaint of " review symptoms and test results." Patient's past medical history and cardiac risk factors include: Insulin-dependent diabetes mellitus type 2, hypertension, hyperlipidemia, postmenopausal female, advanced age, former smoker, obesity.  Patient was seen in consultation back in April 2021 after being referred to the office by her primary care provider for evaluation of palpitations and PVCs.    Palpitations: At the initial visit patient stated her symptoms of palpitations started back in March 2021.  She noticed that she has been having extra heartbeats and this has been increasing in frequency.  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  However recently she has noticed increased palpitations which she thinks is more PVCs.  She addressed this to her primary care provider and she sent to cardiology for further evaluation.  Patient states that these episodes happened on a daily basis, more prominent during nighttime hours, last for few seconds to minutes, usually self-limited.  Patient states that she decrease her caffeine intake which have helped her symptoms overall but not resolved.   At the last office visit patient was recommended to undergo an echocardiogram and a 24-hour Holter monitor to reevaluate PVC burden.  Results of the echocardiogram were noted with the patient at today's office visit and noted below for further reference.  Patient did have a Holter monitor but the results are not available for review. She is notified that she may need to repeat it if the data is not  available.   Since last visit patient states her symptoms of palpitation are stable and not associated with lightheadedness, dizziness, near syncope or syncope.  No worsening factors.    Chest pain: At the last office visit patient mentioned symptoms of atypical chest pain.  Cardiovascular status including insulin-dependent diabetes mellitus type 2 she is recommended to undergo nuclear stress test to rule out reversible ischemia.  Myocardial perfusion reported normal perfusion and overall low risk study.  Since last visit patient states that her symptoms of chest pain have improved. Not effort related and not taken any nitro tablets.   Denies prior history of coronary artery disease, myocardial infarction, congestive heart failure, deep venous thrombosis, pulmonary embolism, stroke, transient ischemic attack.  ALLERGIES: Allergies  Allergen Reactions  . Epinephrine   . Novocain [Procaine]   . Penicillins    MEDICATION LIST PRIOR TO VISIT: Current Outpatient Medications on File Prior to Visit  Medication Sig Dispense Refill  . atorvastatin (LIPITOR) 80 MG tablet Take 40 mg by mouth daily.    . Calcium Carb-Cholecalciferol 600-800 MG-UNIT CHEW Chew 600 mg by mouth daily.    . Dulaglutide (TRULICITY) 1.91 YN/8.2NF SOPN Inject 0.75 mLs into the skin once a week.    . enalapril (VASOTEC) 2.5 MG tablet Take 2.5 mg by mouth daily.    . Insulin Lispro Prot & Lispro (HUMALOG MIX 75/25 KWIKPEN) (75-25) 100 UNIT/ML Kwikpen As directed.    Marland Kitchen levothyroxine (SYNTHROID, LEVOTHROID) 125 MCG tablet Take 125 mcg by mouth daily before breakfast.    . loratadine (CLARITIN) 10 MG tablet Take 10 mg by mouth as needed.    Marland Kitchen  metFORMIN (GLUCOPHAGE) 1000 MG tablet Take 1,000 mg by mouth 2 (two) times daily with a meal.    . metoprolol tartrate (LOPRESSOR) 25 MG tablet Take 25 mg by mouth daily.      No current facility-administered medications on file prior to visit.    PAST MEDICAL HISTORY: Past Medical  History:  Diagnosis Date  . Diabetes mellitus without complication ([place])   . High cholesterol   . Hypertension   . Hypothyroid     PAST SURGICAL HISTORY: Past Surgical History:  Procedure Laterality Date  . APPENDECTOMY    . CATARACT EXTRACTION, BILATERAL  2020  . CESAREAN SECTION    . CHOLECYSTECTOMY    . LAMINECTOMY      FAMILY HISTORY: The patient family history includes Arthritis in her mother; Breast cancer in her mother and sister; Heart attack in her sister; Hyperlipidemia in her brother and father; Hypertension in her brother and father; Lung cancer in her father and sister; Thyroid cancer in her mother.   SOCIAL HISTORY:  The patient  reports that she quit smoking about 56 years ago. Her smoking use included cigarettes. She has never used smokeless tobacco. She reports previous alcohol use. She reports that she does not use drugs.  REVIEW OF SYSTEMS: Review of Systems  Constitution: Negative for chills and fever.  HENT: Negative for ear discharge, ear pain and nosebleeds.   Eyes: Negative for blurred vision and discharge.  Cardiovascular: Positive for leg swelling and palpitations. Negative for chest pain, claudication, dyspnea on exertion, near-syncope, orthopnea, paroxysmal nocturnal dyspnea and syncope.  Respiratory: Negative for cough and shortness of breath.   Endocrine: Negative for polydipsia, polyphagia and polyuria.  Hematologic/Lymphatic: Negative for bleeding problem.  Skin: Negative for flushing and nail changes.  Musculoskeletal: Negative for muscle cramps, muscle weakness and myalgias.  Gastrointestinal: Negative for abdominal pain, dysphagia, hematemesis, hematochezia, melena, nausea and vomiting.  Neurological: Negative for dizziness, focal weakness and light-headedness.   PHYSICAL EXAM: Vitals with BMI 07/30/2019 06/13/2019 05/23/2018  Height _0  _1  _2   Weight 200 lbs 14 oz 205 lbs 200 lbs  BMI 31.46 14.9 70.26  Systolic 378 588 -  Diastolic  66 70 -  Pulse 67 65 -   CONSTITUTIONAL: Well-developed and well-nourished. No acute distress.  SKIN: Skin is warm and dry. No rash noted. No cyanosis. No pallor. No jaundice HEAD: Normocephalic and atraumatic.  EYES: No scleral icterus MOUTH/THROAT: Moist oral membranes.  NECK: No JVD present. No thyromegaly noted. No carotid bruits  LYMPHATIC: No visible cervical adenopathy.  CHEST Normal respiratory effort. No intercostal retractions  LUNGS: Clear to auscultation bilaterally.  No stridor. No wheezes. No rales.  CARDIOVASCULAR: Regular rate and rhythm, positive S1-S2, no murmurs rubs or gallops appreciated. ABDOMINAL: Obese, soft, nontender, nondistended, positive bowel sounds in all 4 quadrants.  No apparent ascites.  EXTREMITIES: Trace peripheral edema, warm to touch bilaterally. HEMATOLOGIC: No significant bruising NEUROLOGIC: Oriented to person, place, and time. Nonfocal. Normal muscle tone.  PSYCHIATRIC: Normal mood and affect. Normal behavior. Cooperative  CARDIAC DATABASE: EKG: 06/13/2019: Normal sinus rhythm, ventricular rate 69 bpm, incomplete right bundle branch block, poor R wave progression, no evidence of underlying ischemia or myocardial injury pattern.  Echocardiogram: 06/27/2019: LVEF 65%, mild LVH, grade 2 diastolic impairment, mildly dilated left atrium, elevated LAP, mild MR, mild TR, no pulmonary hypertension.  Stress Testing: Lexiscan Tetrofosmin Stress Test  06/27/2019: Myocardial perfusion is normal. Overall LV systolic function is normal without regional wall motion abnormalities. Stress  LV EF: 56%.  Heart Catheterization: None  LABORATORY DATA: External Labs: Collected: May 28, 2019 Creatinine 0.71 mg/dL. eGFR: 81 mL/min per 1.73 m TSH: 1.31  FINAL MEDICATION LIST END OF ENCOUNTER: Meds ordered this encounter  Medications  . furosemide (LASIX) 20 MG tablet    Sig: Take 1 tablet (20 mg total) by mouth in the morning.    Dispense:  90 tablet     Refill:  0    Medications Discontinued During This Encounter  Medication Reason  . canagliflozin (INVOKANA) 300 MG TABS tablet Discontinued by provider  . Insulin Glargine-Lixisenatide (SOLIQUA) 100-33 UNT-MCG/ML SOPN Discontinued by provider  . furosemide (LASIX) 20 MG tablet Reorder     Current Outpatient Medications:  .  atorvastatin (LIPITOR) 80 MG tablet, Take 40 mg by mouth daily., Disp: , Rfl:  .  Calcium Carb-Cholecalciferol 600-800 MG-UNIT CHEW, Chew 600 mg by mouth daily., Disp: , Rfl:  .  Dulaglutide (TRULICITY) 8.84 ZY/6.0YT SOPN, Inject 0.75 mLs into the skin once a week., Disp: , Rfl:  .  enalapril (VASOTEC) 2.5 MG tablet, Take 2.5 mg by mouth daily., Disp: , Rfl:  .  furosemide (LASIX) 20 MG tablet, Take 1 tablet (20 mg total) by mouth in the morning., Disp: 90 tablet, Rfl: 0 .  Insulin Lispro Prot & Lispro (HUMALOG MIX 75/25 KWIKPEN) (75-25) 100 UNIT/ML Kwikpen, As directed., Disp: , Rfl:  .  levothyroxine (SYNTHROID, LEVOTHROID) 125 MCG tablet, Take 125 mcg by mouth daily before breakfast., Disp: , Rfl:  .  loratadine (CLARITIN) 10 MG tablet, Take 10 mg by mouth as needed., Disp: , Rfl:  .  metFORMIN (GLUCOPHAGE) 1000 MG tablet, Take 1,000 mg by mouth 2 (two) times daily with a meal., Disp: , Rfl:  .  metoprolol tartrate (LOPRESSOR) 25 MG tablet, Take 25 mg by mouth daily. , Disp: , Rfl:   IMPRESSION:    ICD-10-CM   1. PVC (premature ventricular contraction)  I49.3   2. Precordial chest pain  R07.2   3. Encounter to discuss test results  Z71.2   4. Palpitations  R00.2   5. Benign hypertension  I10   6. Mixed hyperlipidemia  E78.2   7. Type 2 diabetes mellitus with diabetic neuropathy, with long-term current use of insulin (HCC)  E11.40    Z79.4   8. Long-term insulin use (HCC)  Z79.4   9. Former smoker  Z87.891   54. Class 1 obesity due to excess calories with serious comorbidity and body mass index (BMI) of 32.0 to 32.9 in adult  E66.09    Z68.32   11. Leg  swelling  M79.89 furosemide (LASIX) 20 MG tablet    Basic metabolic panel    Magnesium     RECOMMENDATIONS: Laura Holland is a 74 y.o. female whose past medical history and cardiac risk factors include: Insulin-dependent diabetes mellitus type 2, hypertension, hyperlipidemia, postmenopausal female, advanced age, former smoker, obesity.  Palpitations:  Echocardiogram results reviewed with the patient.  LVEF is preserved.  She does have evidence of diastolic dysfunction and elevated left atrial pressure.  May benefit from low dose diuretic tx.   We will reorder 24-hour Holter monitor to evaluate PVC burden.  May consider transitioning her to Toprol-XL at the next office visit.    Continue beta-blocker therapy.  TSH level was recently checked by his primary provider and reviewed.  Precordial chest pain: Stable.   Her symptoms are atypical. Her Myocardial perfusion is normal. Overall LV systolic function is normal without  regional wall motion abnormalities. Stress LV EF: 56%.  Continue lifestyle modifications and addressing modifiable cardiovascular risk factors.  Monitor for now.   Insulin-dependent diabetes mellitus type 2: Management currently  Mixed hyperlipidemia . Continue statin therapy.   . Follow lipids. . Currently managed by primary care provider. . Patient denies myalgia or other side effects.  Former smoker: Educated on the importance of continued smoking cessation.  Lower extremity swelling:  Patient takes Lasix on a as needed basis.  Recommended to take Lasix 20 mg p.o. every morning.  Given the fact that she has lower extremity swelling and elevated left atrial pressure.  Blood work in 1 week to evaluate kidney function and electrolytes.  Orders Placed This Encounter  Procedures  . Basic metabolic panel  . Magnesium   --Continue cardiac medications as reconciled in final medication list. --Return in about 6 weeks (around 09/10/2019) for review test results  and symptoms.. Or sooner if needed. --Continue follow-up with your primary care physician regarding the management of your other chronic comorbid conditions.  Patient's questions and concerns were addressed to her and her husband's satisfaction. She voices understanding of the instructions provided during this encounter.   This note was created using a voice recognition software as a result there may be grammatical errors inadvertently enclosed that do not reflect the nature of this encounter. Every attempt is made to correct such errors.  Rex Kras, [place], [facility]  Pager: [phone] Office: [phone]

## 2019-07-30 ENCOUNTER — Other Ambulatory Visit: Payer: Self-pay

## 2019-07-30 ENCOUNTER — Encounter: Payer: Self-pay | Admitting: Cardiology

## 2019-07-30 ENCOUNTER — Ambulatory Visit: Payer: Medicare Other | Admitting: Cardiology

## 2019-07-30 VITALS — BP 123/66 | HR 67 | Ht 67.0 in | Wt 200.9 lb

## 2019-07-30 DIAGNOSIS — M7989 Other specified soft tissue disorders: Secondary | ICD-10-CM

## 2019-07-30 DIAGNOSIS — E1140 Type 2 diabetes mellitus with diabetic neuropathy, unspecified: Secondary | ICD-10-CM

## 2019-07-30 DIAGNOSIS — I493 Ventricular premature depolarization: Secondary | ICD-10-CM

## 2019-07-30 DIAGNOSIS — Z87891 Personal history of nicotine dependence: Secondary | ICD-10-CM

## 2019-07-30 DIAGNOSIS — E6609 Other obesity due to excess calories: Secondary | ICD-10-CM

## 2019-07-30 DIAGNOSIS — R072 Precordial pain: Secondary | ICD-10-CM

## 2019-07-30 DIAGNOSIS — Z794 Long term (current) use of insulin: Secondary | ICD-10-CM

## 2019-07-30 DIAGNOSIS — R002 Palpitations: Secondary | ICD-10-CM

## 2019-07-30 DIAGNOSIS — E782 Mixed hyperlipidemia: Secondary | ICD-10-CM

## 2019-07-30 DIAGNOSIS — I10 Essential (primary) hypertension: Secondary | ICD-10-CM

## 2019-07-30 DIAGNOSIS — Z712 Person consulting for explanation of examination or test findings: Secondary | ICD-10-CM

## 2019-07-30 MED ORDER — FUROSEMIDE 20 MG PO TABS: 20.0000 mg | ORAL_TABLET | Freq: Every morning | ORAL | 0 refills | Status: DC

## 2019-08-07 ENCOUNTER — Other Ambulatory Visit: Payer: Self-pay | Admitting: Cardiology

## 2019-08-07 HISTORY — PX: OTHER SURGICAL HISTORY: SHX169

## 2019-08-08 LAB — BASIC METABOLIC PANEL
BUN/Creatinine Ratio: 17 (ref 12–28)
BUN: 16 mg/dL (ref 8–27)
CO2: 27 mmol/L (ref 20–29)
Calcium: 9.7 mg/dL (ref 8.7–10.3)
Chloride: 104 mmol/L (ref 96–106)
Creatinine, Ser: 0.92 mg/dL (ref 0.57–1.00)
GFR calc Af Amer: 72 mL/min/{1.73_m2} (ref >59)
GFR calc non Af Amer: 63 mL/min/{1.73_m2} (ref >59)
Glucose: 154 mg/dL — ABNORMAL HIGH (ref 65–99)
Potassium: 4.5 mmol/L (ref 3.5–5.2)
Sodium: 143 mmol/L (ref 134–144)

## 2019-08-08 LAB — MAGNESIUM: Magnesium: 1.7 mg/dL (ref 1.6–2.3)

## 2019-09-11 ENCOUNTER — Ambulatory Visit: Payer: Medicare Other | Admitting: Cardiology

## 2019-09-11 ENCOUNTER — Other Ambulatory Visit: Payer: Self-pay

## 2019-09-11 ENCOUNTER — Encounter: Payer: Self-pay | Admitting: Cardiology

## 2019-09-11 VITALS — BP 124/69 | HR 71 | Ht 67.0 in | Wt 198.2 lb

## 2019-09-11 DIAGNOSIS — Z87891 Personal history of nicotine dependence: Secondary | ICD-10-CM

## 2019-09-11 DIAGNOSIS — I10 Essential (primary) hypertension: Secondary | ICD-10-CM

## 2019-09-11 DIAGNOSIS — R002 Palpitations: Secondary | ICD-10-CM

## 2019-09-11 DIAGNOSIS — E1140 Type 2 diabetes mellitus with diabetic neuropathy, unspecified: Secondary | ICD-10-CM

## 2019-09-11 DIAGNOSIS — Z712 Person consulting for explanation of examination or test findings: Secondary | ICD-10-CM

## 2019-09-11 DIAGNOSIS — Z794 Long term (current) use of insulin: Secondary | ICD-10-CM

## 2019-09-11 DIAGNOSIS — E782 Mixed hyperlipidemia: Secondary | ICD-10-CM

## 2019-09-11 DIAGNOSIS — M7989 Other specified soft tissue disorders: Secondary | ICD-10-CM

## 2019-09-11 MED ORDER — METOPROLOL SUCCINATE ER 25 MG PO TB24: 25.0000 mg | ORAL_TABLET | Freq: Every day | ORAL | 1 refills | Status: DC

## 2019-09-11 NOTE — Progress Notes (Signed)
Laura Holland Date of Birth: [birth year] MRN: [id number] Primary Care Provider:Smith, Hal Hope, MD Primary Cardiologist: Rex Kras, DO (established care 06/13/2019)  Date: 09/11/19 Last Office Visit: 07/30/2019  Chief Complaint  Patient presents with  . Palpitations  . Follow-up    test results    HPI  Laura Holland is a 74 y.o. female who presents to the office with a chief complaint of " palpitation follow up and review test results." Patient's past medical history and cardiac risk factors include: Insulin-dependent diabetes mellitus type 2, hypertension, hyperlipidemia, postmenopausal female, advanced age, former smoker, obesity.  Patient was seen in consultation back in April 2021 after being referred to the office by her primary care provider for evaluation of palpitations and PVCs.    Palpitations: Symptoms of palpitations started back in March 2021.  She noticed that she has been having extra heartbeats and this has been increasing in frequency.  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  Her initial note that the palpitations are most likely secondary to PVC burden and therefore was referred to cardiology for further evaluation.  During the work-up of her palpitation she was also recommended to decrease her caffeine intake and on subsequent visit she noted that her symptoms have improved significantly as result of reducing caffeine intake on a daily basis.    Patient has undergone a 24-hour Holter monitor which notes predominant rhythm to be normal sinus.  Average heart rate of 66 bpm, no atrial fibrillation, no significant ventricular ectopic burden or supraventricular ectopic burden, triggered events correlated to normal sinus rhythm.  At last office visit patient was also recommended to take Lasix on a daily basis given her lower extremity swelling and have a follow-up 1 week blood work.  Independently reviewed the blood work that was done  on August 07, 2019 which noted preserved renal function and potassium/magnesium levels within normal limits.  And clinically patient's lower extremity swelling has improved since last visit.   Since last office visit patient has not had any episodes of chest discomfort.  She has undergone an ischemic evaluation and the results were reviewed with her at the last office visit and noted below for further reference.  Denies prior history of coronary artery disease, myocardial infarction, congestive heart failure, deep venous thrombosis, pulmonary embolism, stroke, transient ischemic attack.  ALLERGIES: Allergies  Allergen Reactions  . Epinephrine   . Novocain [Procaine]   . Penicillins    MEDICATION LIST PRIOR TO VISIT: Current Outpatient Medications on File Prior to Visit  Medication Sig Dispense Refill  . atorvastatin (LIPITOR) 80 MG tablet Take 40 mg by mouth daily.    . Calcium Carb-Cholecalciferol 600-800 MG-UNIT CHEW Chew 600 mg by mouth daily.    . Dulaglutide (TRULICITY) 5.62 BW/3.8LH SOPN Inject 0.75 mLs into the skin once a week.    . enalapril (VASOTEC) 2.5 MG tablet Take 2.5 mg by mouth daily.    . furosemide (LASIX) 20 MG tablet Take 1 tablet (20 mg total) by mouth in the morning. 90 tablet 0  . Insulin Lispro Prot & Lispro (HUMALOG MIX 75/25 KWIKPEN) (75-25) 100 UNIT/ML Kwikpen As directed.    Marland Kitchen levothyroxine (SYNTHROID, LEVOTHROID) 125 MCG tablet Take 125 mcg by mouth daily before breakfast.    . loratadine (CLARITIN) 10 MG tablet Take 10 mg by mouth as needed.    . Magnesium 400 MG CAPS Take 1 capsule by mouth daily.    . metFORMIN (GLUCOPHAGE)  1000 MG tablet Take 1,000 mg by mouth 2 (two) times daily with a meal.     No current facility-administered medications on file prior to visit.    PAST MEDICAL HISTORY: Past Medical History:  Diagnosis Date  . Diabetes mellitus without complication ([place])   . High cholesterol   . Hypertension   . Hypothyroid     PAST SURGICAL  HISTORY: Past Surgical History:  Procedure Laterality Date  . APPENDECTOMY    . CATARACT EXTRACTION, BILATERAL  2020  . CESAREAN SECTION    . CHOLECYSTECTOMY    . dental implants  08/2019  . LAMINECTOMY      FAMILY HISTORY: The patient family history includes Arthritis in her mother; Breast cancer in her mother and sister; Heart attack in her sister; Hyperlipidemia in her brother and father; Hypertension in her brother and father; Lung cancer in her father and sister; Thyroid cancer in her mother.   SOCIAL HISTORY:  The patient  reports that she quit smoking about 56 years ago. Her smoking use included cigarettes. She has never used smokeless tobacco. She reports previous alcohol use. She reports that she does not use drugs.  REVIEW OF SYSTEMS: Review of Systems  Constitutional: Negative for chills and fever.  HENT: Negative for ear discharge, ear pain and nosebleeds.   Eyes: Negative for blurred vision and discharge.  Cardiovascular: Negative for chest pain, claudication, dyspnea on exertion, leg swelling, near-syncope, orthopnea, palpitations, paroxysmal nocturnal dyspnea and syncope.  Respiratory: Negative for cough and shortness of breath.   Endocrine: Negative for polydipsia, polyphagia and polyuria.  Hematologic/Lymphatic: Negative for bleeding problem.  Skin: Negative for flushing and nail changes.  Musculoskeletal: Negative for muscle cramps, muscle weakness and myalgias.  Gastrointestinal: Negative for abdominal pain, dysphagia, hematemesis, hematochezia, melena, nausea and vomiting.  Neurological: Negative for dizziness, focal weakness and light-headedness.   PHYSICAL EXAM: Vitals with BMI 09/11/2019 07/30/2019 06/13/2019  Height '5\' 7"'  '5\' 7"'  '5\' 7"'   Weight 198 lbs 3 oz 200 lbs 14 oz 205 lbs  BMI 31.04 14.97 02.6  Systolic 378 588 502  Diastolic 69 66 70  Pulse 71 67 65   CONSTITUTIONAL: Well-developed and well-nourished. No acute distress.  SKIN: Skin is warm and dry. No  rash noted. No cyanosis. No pallor. No jaundice HEAD: Normocephalic and atraumatic.  EYES: No scleral icterus MOUTH/THROAT: Moist oral membranes.  NECK: No JVD present. No thyromegaly noted. No carotid bruits  LYMPHATIC: No visible cervical adenopathy.  CHEST Normal respiratory effort. No intercostal retractions  LUNGS: Clear to auscultation bilaterally.  No stridor. No wheezes. No rales.  CARDIOVASCULAR: Regular rate and rhythm, positive S1-S2, no murmurs rubs or gallops appreciated. ABDOMINAL: Obese, soft, nontender, nondistended, positive bowel sounds in all 4 quadrants.  No apparent ascites.  EXTREMITIES: Trace peripheral edema, warm to touch bilaterally. HEMATOLOGIC: No significant bruising NEUROLOGIC: Oriented to person, place, and time. Nonfocal. Normal muscle tone.  PSYCHIATRIC: Normal mood and affect. Normal behavior. Cooperative  CARDIAC DATABASE: EKG: 06/13/2019: Normal sinus rhythm, ventricular rate 69 bpm, incomplete right bundle branch block, poor R wave progression, no evidence of underlying ischemia or myocardial injury pattern.  Echocardiogram: 06/27/2019: LVEF 65%, mild LVH, grade 2 diastolic impairment, mildly dilated left atrium, elevated LAP, mild MR, mild TR, no pulmonary hypertension.  Stress Testing: Lexiscan Tetrofosmin Stress Test  06/27/2019: Myocardial perfusion is normal. Overall LV systolic function is normal without regional wall motion abnormalities. Stress LV EF: 56%.  Heart Catheterization: None  24 hour Holter monitor:  Dominant  rhythm normal sinus rhythm.  Heart rate 45-112 bpm. Average heart rate 66 bpm.  No atrial fibrillation/ sinus pause greater than or equal to 2.5 seconds in duration.  Ventricular ectopy was 15 beats, with 1 ventricular pairs and 0 ventricular runs, total ventricular ectopic burden 0.01%.  Supraventricular ectopy was a 71 beats, with 5 supraventricular runs. The longest and the fastest supraventricular run was 3 beats in  duration 115 bpm.  Total supraventricular ectopic burden 0.07%.  Number of patient triggered events: 1, which correlated to normal sinus rhythm.  LABORATORY DATA: External Labs: Collected: May 28, 2019 Creatinine 0.71 mg/dL. eGFR: 81 mL/min per 1.73 m TSH: 1.31  FINAL MEDICATION LIST END OF ENCOUNTER: Meds ordered this encounter  Medications  . metoprolol succinate (TOPROL XL) 25 MG 24 hr tablet    Sig: Take 1 tablet (25 mg total) by mouth daily. Hold if systolic blood pressure (top blood pressure number) less than 100 mmHg or heart rate less than 60 bpm (pulse).    Dispense:  90 tablet    Refill:  1    Medications Discontinued During This Encounter  Medication Reason  . metoprolol tartrate (LOPRESSOR) 25 MG tablet Change in therapy     Current Outpatient Medications:  .  atorvastatin (LIPITOR) 80 MG tablet, Take 40 mg by mouth daily., Disp: , Rfl:  .  Calcium Carb-Cholecalciferol 600-800 MG-UNIT CHEW, Chew 600 mg by mouth daily., Disp: , Rfl:  .  Dulaglutide (TRULICITY) 0.94 BS/9.6GE SOPN, Inject 0.75 mLs into the skin once a week., Disp: , Rfl:  .  enalapril (VASOTEC) 2.5 MG tablet, Take 2.5 mg by mouth daily., Disp: , Rfl:  .  furosemide (LASIX) 20 MG tablet, Take 1 tablet (20 mg total) by mouth in the morning., Disp: 90 tablet, Rfl: 0 .  Insulin Lispro Prot & Lispro (HUMALOG MIX 75/25 KWIKPEN) (75-25) 100 UNIT/ML Kwikpen, As directed., Disp: , Rfl:  .  levothyroxine (SYNTHROID, LEVOTHROID) 125 MCG tablet, Take 125 mcg by mouth daily before breakfast., Disp: , Rfl:  .  loratadine (CLARITIN) 10 MG tablet, Take 10 mg by mouth as needed., Disp: , Rfl:  .  Magnesium 400 MG CAPS, Take 1 capsule by mouth daily., Disp: , Rfl:  .  metFORMIN (GLUCOPHAGE) 1000 MG tablet, Take 1,000 mg by mouth 2 (two) times daily with a meal., Disp: , Rfl:  .  metoprolol succinate (TOPROL XL) 25 MG 24 hr tablet, Take 1 tablet (25 mg total) by mouth daily. Hold if systolic blood pressure (top blood  pressure number) less than 100 mmHg or heart rate less than 60 bpm (pulse)., Disp: 90 tablet, Rfl: 1  IMPRESSION:    ICD-10-CM   1. Encounter to discuss test results  Z71.2   2. Palpitations  R00.2 metoprolol succinate (TOPROL XL) 25 MG 24 hr tablet  3. Leg swelling  M79.89   4. Benign hypertension  I10   5. Type 2 diabetes mellitus with diabetic neuropathy, with long-term current use of insulin (HCC)  E11.40    Z79.4   6. Long-term insulin use (HCC)  Z79.4   7. Mixed hyperlipidemia  E78.2   8. Former smoker  Z87.891      RECOMMENDATIONS: Laura Holland is a 74 y.o. female whose past medical history and cardiac risk factors include: Insulin-dependent diabetes mellitus type 2, hypertension, hyperlipidemia, postmenopausal female, advanced age, former smoker, obesity.  Palpitations: Resolved.  Patient has a history of palpitations which were attributed to PVCs in the past.  Since  the symptoms of palpitations restart.  She underwent a 24-hour Holter monitor which did not note any significant ventricular ectopic burden.    Her symptoms of palpitations improved significantly after reducing caffeine intake.    She tolerated Lopressor very well without any side effects or intolerances.    We will transition her to Toprol-XL 25 mg p.o. daily.    Precordial chest pain: Resolved.   Since last office visit patient has not had any symptoms of chest pain.  Echocardiogram notes preserved left ventricular systolic function and nuclear stress test noted normal myocardial perfusion overall low risk study.   Continue lifestyle modifications and addressing modifiable cardiovascular risk factors.  Monitor for now.   Insulin-dependent diabetes mellitus type 2: Management currently  Mixed hyperlipidemia . Continue statin therapy.   . Follow lipids. . Currently managed by primary care provider. . Patient denies myalgia or other side effects.  Former smoker: Educated on the importance of  continued smoking cessation.  Lower extremity swelling: Improved  Patient continues to take Lasix on a daily basis.  We will repeat blood work was independently reviewed with her today's office visit and kidney function electrolytes are within acceptable range.    No orders of the defined types were placed in this encounter.  --Continue cardiac medications as reconciled in final medication list. --Return in about 6 months (around 03/13/2020) for re-evaluation of symptoms palpitations and CP. Or sooner if needed. --Continue follow-up with your primary care physician regarding the management of your other chronic comorbid conditions.  Patient's questions and concerns were addressed to her and her husband's satisfaction. She voices understanding of the instructions provided during this encounter.   This note was created using a voice recognition software as a result there may be grammatical errors inadvertently enclosed that do not reflect the nature of this encounter. Every attempt is made to correct such errors.  Rex Kras, [place], [facility]  Pager: [phone] Office: [phone]

## 2019-09-18 ENCOUNTER — Other Ambulatory Visit: Payer: Self-pay

## 2019-09-18 DIAGNOSIS — M7989 Other specified soft tissue disorders: Secondary | ICD-10-CM

## 2019-10-09 ENCOUNTER — Ambulatory Visit: Payer: Medicare Other | Admitting: Orthopedic Surgery

## 2019-10-10 ENCOUNTER — Encounter: Payer: Self-pay | Admitting: Family

## 2019-10-10 ENCOUNTER — Ambulatory Visit (INDEPENDENT_AMBULATORY_CARE_PROVIDER_SITE_OTHER): Payer: Medicare Other | Admitting: Family

## 2019-10-10 VITALS — Ht 67.0 in | Wt 198.0 lb

## 2019-10-10 DIAGNOSIS — M79641 Pain in right hand: Secondary | ICD-10-CM | POA: Diagnosis not present

## 2019-10-10 DIAGNOSIS — R202 Paresthesia of skin: Secondary | ICD-10-CM | POA: Diagnosis not present

## 2019-10-10 DIAGNOSIS — R200 Anesthesia of skin: Secondary | ICD-10-CM | POA: Diagnosis not present

## 2019-10-10 NOTE — Progress Notes (Signed)
Office Visit Note   Patient: Laura Holland           Date of Birth: 1945/12/24           MRN: [id number] Visit Date: 10/10/2019              Requested by: Carol Ada, [place],  [place] [address] PCP: Carol Ada, MD  Chief Complaint  Patient presents with  . Right Hand - Pain      HPI: The patient is a 74 year old woman who presents today complaining of palmar pain in her right hand.  Associated with stiffness. she is right-hand dominant.  She reports she has knots that have been gradually worsening over weeks to months in the palm of her right hand she is concerned that this may be related to her previous injury.  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  She states when she grips this increases her pain.  When she is driving and grasping the steering wheel she often then experiences numbness of the palm of her right hand she does not associate this with any fingers feels that affects all fingers equally.  She has to remove her hand from the steering wheel and pump the hand to regain feeling.  Does not feel this is any worse upon first awakening  Has been having little relief with ibuprofen.  Assessment & Plan: Visit Diagnoses:  1. Numbness and tingling in right hand   2. 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 Suspected carpal tunnel on right as well. Will send for EMGs, continue anti inflammatories. Will do emg review with Xu.   Follow-Up Instructions: Return for follow up with xu.   Right Hand Exam   Tenderness  The patient is experiencing tenderness in the palmar area.  Range of Motion  The patient has normal right wrist ROM.   Muscle Strength  Grip: 4/5   Tests  Phalen's sign: positive  Other  Pulse: present  Comments:  Palpable knots in palm proximal to a1 pulley of long and 4th fingers   Left Hand Exam    Muscle Strength  Grip:  4/5       Patient is alert, oriented, no adenopathy, well-dressed, normal affect, normal respiratory effort.   Imaging: No results found. No images are attached to the encounter.  Labs: No results found for: HGBA1C, ESRSEDRATE, CRP, LABURIC, REPTSTATUS, GRAMSTAIN, CULT, LABORGA   No results found for: ALBUMIN, PREALBUMIN, LABURIC  Lab Results  Component Value Date   MG 1.7 08/07/2019   No results found for: VD25OH  No results found for: PREALBUMIN No flowsheet data found.   Body mass index is 31.01 kg/m.  Orders:  Orders Placed This Encounter  Procedures  . Ambulatory referral to Physical Medicine Rehab   No orders of the defined types were placed in this encounter.    Procedures: No procedures performed  Clinical Data: No additional findings.  ROS:  All other systems negative, except as noted in the HPI. Review of Systems  Constitutional: Negative for chills and fever.  Musculoskeletal: Positive for arthralgias and myalgias. Negative for joint swelling.  Neurological: Positive for numbness. Negative for weakness.    Objective: Vital Signs: Ht 5\' 7"  (1.702 m)   Wt 198 lb (89.8 kg)   BMI 31.01 kg/m   Specialty Comments:  No specialty comments  available.  PMFS History: Patient Active Problem List   Diagnosis Date Noted  . 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 Diabetes mellitus without complication ([place])   . High cholesterol   . Hypertension   . Hypothyroid     Family History  Problem Relation Age of Onset  . Arthritis Mother   . Breast cancer Mother   . Thyroid cancer Mother   . Lung cancer Father   . Hypertension Father   . Hyperlipidemia Father   . Lung cancer Sister   . Breast cancer Sister   . Heart attack Sister   . Hypertension Brother   . Hyperlipidemia Brother     Past Surgical History:  Procedure Laterality Date  .  APPENDECTOMY    . CATARACT EXTRACTION, BILATERAL  2020  . CESAREAN SECTION    . CHOLECYSTECTOMY    . dental implants  08/2019  . LAMINECTOMY     Social History   Occupational History  . Not on file  Tobacco Use  . Smoking status: Former Smoker    Types: Cigarettes    Quit date: 1965    Years since quitting: 80.6  . Smokeless tobacco: Never Used  Vaping Use  . Vaping Use: Never used  Substance and Sexual Activity  . Alcohol use: Not Currently    Comment: 20 years without alcohol   . Drug use: Never  . Sexual activity: Not on file

## 2019-10-29 ENCOUNTER — Other Ambulatory Visit: Payer: Self-pay | Admitting: Cardiology

## 2019-10-29 DIAGNOSIS — M7989 Other specified soft tissue disorders: Secondary | ICD-10-CM

## 2019-11-23 ENCOUNTER — Other Ambulatory Visit: Payer: Self-pay

## 2019-11-23 ENCOUNTER — Encounter: Payer: Self-pay | Admitting: Physical Medicine and Rehabilitation

## 2019-11-23 ENCOUNTER — Ambulatory Visit (INDEPENDENT_AMBULATORY_CARE_PROVIDER_SITE_OTHER): Payer: Medicare Other | Admitting: Physical Medicine and Rehabilitation

## 2019-11-23 DIAGNOSIS — R202 Paresthesia of skin: Secondary | ICD-10-CM

## 2019-11-23 NOTE — Progress Notes (Signed)
Numbness in right hand when trying to grip (steering wheel, etc.). Symptoms are relieved with straightening fingers.  Right hand dominant. No lotion per patient. Numeric Pain Rating Scale and Functional Assessment Average Pain 8   In the last MONTH (on 0-10 scale) has pain interfered with the following?  1. General activity like being  able to carry out your everyday physical activities such as walking, climbing stairs, carrying groceries, or moving a chair?  Rating(5)

## 2019-11-27 NOTE — Procedures (Signed)
EMG & NCV Findings: Evaluation of the right median motor nerve showed prolonged distal onset latency (5.9 ms) and decreased conduction velocity (Elbow-Wrist, 47 m/s).  The right median (across palm) sensory nerve showed no response (Palm) and prolonged distal peak latency (5.8 ms).  All remaining nerves (as indicated in the following tables) were within normal limits.    All examined muscles (as indicated in the following table) showed no evidence of electrical instability.    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   There is no significant electrodiagnostic evidence of any other focal nerve entrapment, brachial plexopathy or cervical radiculopathy.   Recommendations: 1.  Follow-up with referring physician. 2.  Continue current management of symptoms. 3.  Continue use of resting splint at night-time and as needed during the day. 4.  Suggest surgical evaluation.  ___________________________ Laurence Spates FAAPMR Board Certified, American Board of Physical Medicine and Rehabilitation    Nerve Conduction Studies Anti Sensory Summary Table   Stim Site NR Peak (ms) Norm Peak (ms) P-T Amp (V) Norm P-T Amp Site1 Site2 Delta-P (ms) Dist (cm) Vel (m/s) Norm Vel (m/s)  Right Median Acr Palm Anti Sensory (2nd Digit)  31.5C  Wrist    *5.8 <3.6 16.1 >10 Wrist Palm  0.0    Palm *NR  <2.0          Right Radial Anti Sensory (Base 1st Digit)  31.3C  Wrist    2.2 <3.1 17.6  Wrist Base 1st Digit 2.2 0.0    Right Ulnar Anti Sensory (5th Digit)  31.6C  Wrist    3.4 <3.7 25.7 >15.0 Wrist 5th Digit 3.4 14.0 41 >38   Motor Summary Table   Stim Site NR Onset (ms) Norm Onset (ms) O-P Amp (mV) Norm O-P Amp Site1 Site2 Delta-0 (ms) Dist (cm) Vel (m/s) Norm Vel (m/s)  Right Median Motor (Abd Poll Brev)  31.2C  Wrist    *5.9 <4.2 5.5 >5 Elbow  Wrist 4.5 21.0 *47 >50  Elbow    10.4  5.2         Right Ulnar Motor (Abd Dig Min)  31.3C  Wrist    3.1 <4.2 6.7 >3 B Elbow Wrist 3.5 19.0 54 >53  B Elbow    6.6  5.0  A Elbow B Elbow 1.4 10.0 71 >53  A Elbow    8.0  6.2          EMG   Side Muscle Nerve Root Ins Act Fibs Psw Amp Dur Poly Recrt Int Fraser Din Comment  Right Abd Poll Brev Median C8-T1 Nml Nml Nml Nml Nml 0 Nml Nml   Right 1stDorInt Ulnar C8-T1 Nml Nml Nml Nml Nml 0 Nml Nml   Right PronatorTeres Median C6-7 Nml Nml Nml Nml Nml 0 Nml Nml   Right Biceps Musculocut C5-6 Nml Nml Nml Nml Nml 0 Nml Nml   Right Deltoid Axillary C5-6 Nml Nml Nml Nml Nml 0 Nml Nml     Nerve Conduction Studies Anti Sensory Left/Right Comparison   Stim Site L Lat (ms) R Lat (ms) L-R Lat (ms) L Amp (V) R Amp (V) L-R Amp (%) Site1 Site2 L Vel (m/s) R Vel (m/s) L-R Vel (m/s)  Median Acr Palm Anti Sensory (2nd Digit)  31.5C  Wrist  *5.8   16.1  Wrist [employer]  Radial Anti Sensory (Base 1st Digit)  31.3C  Wrist  2.2   17.6  Wrist Base 1st Digit     Ulnar Anti Sensory (5th Digit)  31.6C  Wrist  3.4   25.7  Wrist 5th Digit  41    Motor Left/Right Comparison   Stim Site L Lat (ms) R Lat (ms) L-R Lat (ms) L Amp (mV) R Amp (mV) L-R Amp (%) Site1 Site2 L Vel (m/s) R Vel (m/s) L-R Vel (m/s)  Median Motor (Abd Poll Brev)  31.2C  Wrist  *5.9   5.5  Elbow Wrist  *47   Elbow  10.4   5.2        Ulnar Motor (Abd Dig Min)  31.3C  Wrist  3.1   6.7  B Elbow Wrist  54   B Elbow  6.6   5.0  A Elbow B Elbow  71   A Elbow  8.0   6.2           Waveforms:

## 2019-11-27 NOTE — Progress Notes (Signed)
Laura Holland - 74 y.o. female MRN [id number]  Date of birth: [birth year]  Office Visit Note: Visit Date: 11/23/2019 PCP: Carol Ada, MD Referred by: Carol Ada, MD  Subjective: Chief Complaint  Patient presents with  . Right Hand - Numbness   HPI:  Laura Holland is a 74 y.o. female who comes in today at the request of Dondra Prader, [place] for electrodiagnostic study of the Right upper extremities.  Patient is Right hand dominant.  She reports chronic worsening many months of numbness particularly in the right hand when trying to grip objects such as a steering wheel or phone.  She gets some relief if she straightens her fingers.  She gets worsening symptoms at night.  No radicular complaints.  No specific neck pain.  No history of prior electrodiagnostic study or carpal tunnel release.  No history of diabetes or polyneuropathy.   ROS Otherwise per HPI.  Assessment & Plan: Visit Diagnoses:  1. Paresthesia of skin     Plan: Impression: The above electrodiagnostic study is ABNORMAL and reveals evidence of a moderate right median nerve entrapment at the wrist (carpal tunnel syndrome) affecting sensory and motor components.   There is no significant electrodiagnostic evidence of any other focal nerve entrapment, brachial plexopathy or cervical radiculopathy.   Recommendations: 1.  Follow-up with referring physician. 2.  Continue current management of symptoms. 3.  Continue use of resting splint at night-time and as needed during the day. 4.  Suggest surgical evaluation.  Meds & Orders: No orders of the defined types were placed in this encounter.   Orders Placed This Encounter  Procedures  . NCV with EMG (electromyography)    Follow-up: Return for  Eduard Roux, M.D..   Procedures: No procedures performed  EMG & NCV Findings: Evaluation of the right median motor nerve showed prolonged distal onset latency (5.9 ms) and decreased conduction velocity (Elbow-Wrist, 47 m/s).   The right median (across palm) sensory nerve showed no response (Palm) and prolonged distal peak latency (5.8 ms).  All remaining nerves (as indicated in the following tables) were within normal limits.    All examined muscles (as indicated in the following table) showed no evidence of electrical instability.    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   There is no significant electrodiagnostic evidence of any other focal nerve entrapment, brachial plexopathy or cervical radiculopathy.   Recommendations: 1.  Follow-up with referring physician. 2.  Continue current management of symptoms. 3.  Continue use of resting splint at night-time and as needed during the day. 4.  Suggest surgical evaluation.  ___________________________ Laurence Spates FAAPMR Board Certified, American Board of Physical Medicine and Rehabilitation    Nerve Conduction Studies Anti Sensory Summary Table   Stim Site NR Peak (ms) Norm Peak (ms) P-T Amp (V) Norm P-T Amp Site1 Site2 Delta-P (ms) Dist (cm) Vel (m/s) Norm Vel (m/s)  Right Median Acr Palm Anti Sensory (2nd Digit)  31.5C  Wrist    *5.8 <3.6 16.1 >10 Wrist Palm  0.0    Palm *NR  <2.0          Right Radial Anti Sensory (Base 1st Digit)  31.3C  Wrist    2.2 <3.1 17.6  Wrist Base 1st Digit 2.2 0.0    Right Ulnar Anti Sensory (5th Digit)  31.6C  Wrist    3.4 <3.7 25.7 >  15.0 Wrist 5th Digit 3.4 14.0 41 >38   Motor Summary Table   Stim Site NR Onset (ms) Norm Onset (ms) O-P Amp (mV) Norm O-P Amp Site1 Site2 Delta-0 (ms) Dist (cm) Vel (m/s) Norm Vel (m/s)  Right Median Motor (Abd Poll Brev)  31.2C  Wrist    *5.9 <4.2 5.5 >5 Elbow Wrist 4.5 21.0 *47 >50  Elbow    10.4  5.2         Right Ulnar Motor (Abd Dig Min)  31.3C  Wrist    3.1 <4.2 6.7 >3 B Elbow Wrist 3.5 19.0 54 >53  B Elbow    6.6  5.0   A Elbow B Elbow 1.4 10.0 71 >53  A Elbow    8.0  6.2          EMG   Side Muscle Nerve Root Ins Act Fibs Psw Amp Dur Poly Recrt Int Fraser Din Comment  Right Abd Poll Brev Median C8-T1 Nml Nml Nml Nml Nml 0 Nml Nml   Right 1stDorInt Ulnar C8-T1 Nml Nml Nml Nml Nml 0 Nml Nml   Right PronatorTeres Median C6-7 Nml Nml Nml Nml Nml 0 Nml Nml   Right Biceps Musculocut C5-6 Nml Nml Nml Nml Nml 0 Nml Nml   Right Deltoid Axillary C5-6 Nml Nml Nml Nml Nml 0 Nml Nml     Nerve Conduction Studies Anti Sensory Left/Right Comparison   Stim Site L Lat (ms) R Lat (ms) L-R Lat (ms) L Amp (V) R Amp (V) L-R Amp (%) Site1 Site2 L Vel (m/s) R Vel (m/s) L-R Vel (m/s)  Median Acr Palm Anti Sensory (2nd Digit)  31.5C  Wrist  *5.8   16.1  Wrist Palm     Palm             Radial Anti Sensory (Base 1st Digit)  31.3C  Wrist  2.2   17.6  Wrist Base 1st Digit     Ulnar Anti Sensory (5th Digit)  31.6C  Wrist  3.4   25.7  Wrist 5th Digit  41    Motor Left/Right Comparison   Stim Site L Lat (ms) R Lat (ms) L-R Lat (ms) L Amp (mV) R Amp (mV) L-R Amp (%) Site1 Site2 L Vel (m/s) R Vel (m/s) L-R Vel (m/s)  Median Motor (Abd Poll Brev)  31.2C  Wrist  *5.9   5.5  Elbow Wrist  *47   Elbow  10.4   5.2        Ulnar Motor (Abd Dig Min)  31.3C  Wrist  3.1   6.7  B Elbow Wrist  54   B Elbow  6.6   5.0  A Elbow B Elbow  71   A Elbow  8.0   6.2           Waveforms:             Clinical History: No specialty comments available.     Objective:  VS:  HT:    WT:   BMI:     BP:   HR: bpm  TEMP: ( )  RESP:  Physical Exam Musculoskeletal:        General: No swelling, tenderness or deformity.     Comments: Inspection reveals no atrophy of the bilateral APB or FDI or hand intrinsics. There is no swelling, color changes, allodynia or dystrophic changes. There is 5 out of 5 strength in the bilateral wrist extension, finger abduction and long finger flexion. There is intact  sensation to light touch in all dermatomal  and peripheral nerve distributions. There is a negative Hoffmann's test bilaterally.  Skin:    General: Skin is warm and dry.     Findings: No erythema or rash.  Neurological:     General: No focal deficit present.     Mental Status: She is alert and oriented to person, place, and time.     Motor: No weakness or abnormal muscle tone.     Coordination: Coordination normal.  Psychiatric:        Mood and Affect: Mood normal.        Behavior: Behavior normal.      Imaging: No results found.

## 2019-12-11 ENCOUNTER — Ambulatory Visit (INDEPENDENT_AMBULATORY_CARE_PROVIDER_SITE_OTHER): Payer: Medicare Other | Admitting: Orthopaedic Surgery

## 2019-12-11 ENCOUNTER — Encounter: Payer: Self-pay | Admitting: Orthopaedic Surgery

## 2019-12-11 DIAGNOSIS — G5601 Carpal tunnel syndrome, right upper limb: Secondary | ICD-10-CM | POA: Diagnosis not present

## 2019-12-11 MED ORDER — CELECOXIB 200 MG PO CAPS: 200.0000 mg | ORAL_CAPSULE | Freq: Two times a day (BID) | ORAL | 3 refills | Status: DC

## 2019-12-11 NOTE — Progress Notes (Signed)
Office Visit Note   Patient: Laura Holland           Date of Birth: 28-Oct-1945           MRN: [id number] Visit Date: 12/11/2019              Requested by: Carol Ada, [place],  [place] [id number] PCP: Carol Ada, MD   Assessment & Plan: Visit Diagnoses:  1. Right carpal tunnel syndrome     Plan: Impression is moderate right carpal tunnel syndrome.  Studies were reviewed with the patient in detail and we had discussion of treatment options and based on discussion she would likely move forward with surgical release early next year.  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  Follow-up as needed.  Follow-Up Instructions: Return if symptoms worsen or fail to improve.   Orders:  No orders of the defined types were placed in this encounter.  Meds ordered this encounter  Medications  . celecoxib (CELEBREX) 200 MG capsule    Sig: Take 1 capsule (200 mg total) by mouth 2 (two) times daily.    Dispense:  30 capsule    Refill:  3      Procedures: No procedures performed   Clinical Data: No additional findings.   Subjective: Chief Complaint  Patient presents with  . Right Hand - Pain    [name] is here for nerve conduction study review.  Moderate carpal tunnel syndrome was found on recent studies.  She is also having some pain in her long finger.  Denies any medical changes.   Review of Systems  Constitutional: Negative.   HENT: Negative.   Eyes: Negative.   Respiratory: Negative.   Cardiovascular: Negative.   Endocrine: Negative.   Musculoskeletal: Negative.   Neurological: Negative.   Hematological: Negative.   Psychiatric/Behavioral: Negative.   All other systems reviewed and are negative.    Objective: Vital Signs: There were no vitals taken for this visit.  Physical Exam Vitals and nursing note reviewed.  Constitutional:      Appearance: She is well-developed.    Pulmonary:     Effort: Pulmonary effort is normal.  Skin:    General: Skin is warm.     Capillary Refill: Capillary refill takes less than 2 seconds.  Neurological:     Mental Status: She is alert and oriented to person, place, and time.  Psychiatric:        Behavior: Behavior normal.        Thought Content: Thought content normal.        Judgment: Judgment normal.     Ortho Exam Right long finger shows slight tenderness palpation.  There is no triggering. Specialty Comments:  No specialty comments available.  Imaging: No results found.   PMFS History: Patient Active Problem List   Diagnosis Date Noted  . Right carpal tunnel syndrome 12/11/2019  . 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 Diabetes mellitus without complication ([place])   . High cholesterol   . Hypertension   . Hypothyroid     Family History  Problem Relation Age of Onset  . Arthritis Mother   . Breast cancer Mother   . Thyroid cancer Mother   . Lung cancer Father   . Hypertension Father   . Hyperlipidemia Father   . Lung cancer Sister   . Breast  cancer Sister   . Heart attack Sister   . Hypertension Brother   . Hyperlipidemia Brother     Past Surgical History:  Procedure Laterality Date  . APPENDECTOMY    . CATARACT EXTRACTION, BILATERAL  2020  . CESAREAN SECTION    . CHOLECYSTECTOMY    . dental implants  08/2019  . LAMINECTOMY     Social History   Occupational History  . Not on file  Tobacco Use  . Smoking status: Former Smoker    Types: Cigarettes    Quit date: 1965    Years since quitting: 56.7  . Smokeless tobacco: Never Used  Vaping Use  . Vaping Use: Never used  Substance and Sexual Activity  . Alcohol use: Not Currently    Comment: 20 years without alcohol   . Drug use: Never  . Sexual activity: Not on file

## 2020-03-13 ENCOUNTER — Ambulatory Visit: Payer: Medicare Other | Admitting: Cardiology

## 2020-03-17 ENCOUNTER — Encounter: Payer: Self-pay | Admitting: Cardiology

## 2020-03-17 ENCOUNTER — Ambulatory Visit: Payer: Medicare Other | Admitting: Cardiology

## 2020-03-17 ENCOUNTER — Other Ambulatory Visit: Payer: Self-pay

## 2020-03-17 VITALS — BP 138/69 | HR 73 | Ht 67.0 in | Wt 202.0 lb

## 2020-03-17 DIAGNOSIS — E6609 Other obesity due to excess calories: Secondary | ICD-10-CM

## 2020-03-17 DIAGNOSIS — Z87891 Personal history of nicotine dependence: Secondary | ICD-10-CM

## 2020-03-17 DIAGNOSIS — I493 Ventricular premature depolarization: Secondary | ICD-10-CM

## 2020-03-17 DIAGNOSIS — Z6831 Body mass index (BMI) 31.0-31.9, adult: Secondary | ICD-10-CM

## 2020-03-17 DIAGNOSIS — E782 Mixed hyperlipidemia: Secondary | ICD-10-CM

## 2020-03-17 DIAGNOSIS — M7989 Other specified soft tissue disorders: Secondary | ICD-10-CM

## 2020-03-17 DIAGNOSIS — Z794 Long term (current) use of insulin: Secondary | ICD-10-CM

## 2020-03-17 DIAGNOSIS — I10 Essential (primary) hypertension: Secondary | ICD-10-CM

## 2020-03-17 DIAGNOSIS — R002 Palpitations: Secondary | ICD-10-CM

## 2020-03-17 DIAGNOSIS — E1140 Type 2 diabetes mellitus with diabetic neuropathy, unspecified: Secondary | ICD-10-CM

## 2020-03-17 MED ORDER — METOPROLOL SUCCINATE ER 25 MG PO TB24: 25.0000 mg | ORAL_TABLET | Freq: Every day | ORAL | 1 refills | Status: DC

## 2020-03-17 NOTE — Progress Notes (Signed)
Laura Holland Date of Birth: March 18, 1945 MRN: [id number] Primary Care Provider:Smith, Hal Hope, MD Primary Cardiologist: Rex Kras, DO (established care 06/13/2019)  Date: 03/17/20 Last Office Visit: 09/11/2019  Chief Complaint  Patient presents with  . Follow-up  . Palpitations    HPI  Laura Holland is a 75 y.o. female who presents to the office with a chief complaint of " 6 months follow-up for palpitations." Patient's past medical history and cardiac risk factors include: Insulin-dependent diabetes mellitus type 2, hypertension, hyperlipidemia, postmenopausal female, advanced age, former smoker, obesity.  Patient was seen in consultation back in April 2021 after being referred to the office by her primary care provider for evaluation of palpitations and PVCs.    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  During initial office visit she was recommended to decrease her caffeine intake and subsequently had noted a significant improvement in her symptoms.  She did undergo 24-hour Holter monitor which noted an average heart rate of 66 bpm, no atrial fibrillation or any significant dysrhythmias.  She was recommended to continue her AV nodal blocking agents and she is here for 26-monthfollow-up   Since last office visit patient states that she is doing well from a cardiovascular standpoint.  Her palpitations are essentially stable.  No hospitalizations or urgent care visits for cardiovascular symptoms.  She has gained approximately 4 pounds over the last 6 months most likely secondary to dietary indiscretion due to the holidays.    Patient's lower extremity swelling has essentially resolved since taking Lasix on a daily basis and watch her sodium intake.    ALLERGIES: Allergies  Allergen Reactions  . Epinephrine   . Novocain [Procaine]   . Penicillins    MEDICATION LIST PRIOR TO VISIT: Current Outpatient Medications on File Prior to  Visit  Medication Sig Dispense Refill  . atorvastatin (LIPITOR) 80 MG tablet Take 40 mg by mouth daily.    . Calcium Carb-Cholecalciferol 600-800 MG-UNIT CHEW Chew 600 mg by mouth daily.    . Dulaglutide (TRULICITY) 08.26MEB/5.8XESOPN Inject 0.75 mLs into the skin once a week.    . enalapril (VASOTEC) 2.5 MG tablet Take 2.5 mg by mouth daily.    . furosemide (LASIX) 20 MG tablet TAKE 1 TABLET (20 MG TOTAL) BY MOUTH IN THE MORNING. 90 tablet 2  . Insulin Lispro Prot & Lispro (HUMALOG 75/25 MIX) (75-25) 100 UNIT/ML Kwikpen As directed.    .Marland Kitchenlevothyroxine (SYNTHROID, LEVOTHROID) 125 MCG tablet Take 125 mcg by mouth daily before breakfast.    . loratadine (CLARITIN) 10 MG tablet Take 10 mg by mouth as needed.    . Magnesium 400 MG CAPS Take 1 capsule by mouth daily.    . metFORMIN (GLUCOPHAGE) 1000 MG tablet Take 1,000 mg by mouth 2 (two) times daily with a meal.    . Multiple Vitamins-Minerals (MULTIVITAMIN WITH MINERALS) tablet Take 1 tablet by mouth daily.     No current facility-administered medications on file prior to visit.    PAST MEDICAL HISTORY: Past Medical History:  Diagnosis Date  . [place]   . High cholesterol   . Hypertension   . Hypothyroid     PAST SURGICAL HISTORY: Past Surgical History:  Procedure Laterality Date  . APPENDECTOMY    . CATARACT EXTRACTION, BILATERAL  2020  . CESAREAN SECTION    . CHOLECYSTECTOMY    . dental implants  08/2019  . LAMINECTOMY  FAMILY HISTORY: The patient family history includes Arthritis in her mother; Breast cancer in her mother and sister; Heart attack in her sister; Hyperlipidemia in her brother and father; Hypertension in her brother and father; Lung cancer in her father and sister; Thyroid cancer in her mother.   SOCIAL HISTORY:  The patient  reports that she quit smoking about 57 years ago. Her smoking use included cigarettes. She has never used smokeless tobacco. She reports previous  alcohol use. She reports that she does not use drugs.  REVIEW OF SYSTEMS: Review of Systems  Constitutional: Negative for chills and fever.  HENT: Negative for ear discharge, ear pain and nosebleeds.   Eyes: Negative for blurred vision and discharge.  Cardiovascular: Negative for chest pain, claudication, dyspnea on exertion, leg swelling, near-syncope, orthopnea, palpitations, paroxysmal nocturnal dyspnea and syncope.  Respiratory: Negative for cough and shortness of breath.   Endocrine: Negative for polydipsia, polyphagia and polyuria.  Hematologic/Lymphatic: Negative for bleeding problem.  Skin: Negative for flushing and nail changes.  Musculoskeletal: Negative for muscle cramps, muscle weakness and myalgias.  Gastrointestinal: Negative for abdominal pain, dysphagia, hematemesis, hematochezia, melena, nausea and vomiting.  Neurological: Negative for dizziness, focal weakness and light-headedness.   PHYSICAL EXAM: Vitals with BMI 03/17/2020 10/10/2019 09/11/2019  Height 5' 7" 5' 7" 5' 7"  Weight 202 lbs 198 lbs 198 lbs 3 oz  BMI 56.81 31 27.51  Systolic 700 - 174  Diastolic 69 - 69  Pulse 73 - 71   CONSTITUTIONAL: Well-developed and well-nourished. No acute distress.  SKIN: Skin is warm and dry. No rash noted. No cyanosis. No pallor. No jaundice HEAD: Normocephalic and atraumatic.  EYES: No scleral icterus MOUTH/THROAT: Moist oral membranes.  NECK: No JVD present. No thyromegaly noted. No carotid bruits  LYMPHATIC: No visible cervical adenopathy.  CHEST Normal respiratory effort. No intercostal retractions  LUNGS: Clear to auscultation bilaterally.  No stridor. No wheezes. No rales.  CARDIOVASCULAR: Regular rate and rhythm, positive S1-S2, no murmurs rubs or gallops appreciated. ABDOMINAL: Obese, soft, nontender, nondistended, positive bowel sounds in all 4 quadrants.  No apparent ascites.  EXTREMITIES: Trace peripheral edema, warm to touch bilaterally. HEMATOLOGIC: No significant  bruising NEUROLOGIC: Oriented to person, place, and time. Nonfocal. Normal muscle tone.  PSYCHIATRIC: Normal mood and affect. Normal behavior. Cooperative  CARDIAC DATABASE: EKG: 06/13/2019: Normal sinus rhythm, ventricular rate 69 bpm, incomplete right bundle branch block, poor R wave progression, no evidence of underlying ischemia or myocardial injury pattern.  Echocardiogram: 06/27/2019: LVEF 65%, mild LVH, grade 2 diastolic impairment, mildly dilated left atrium, elevated LAP, mild MR, mild TR, no pulmonary hypertension.  Stress Testing: Lexiscan Tetrofosmin Stress Test  06/27/2019: Myocardial perfusion is normal. Overall LV systolic function is normal without regional wall motion abnormalities. Stress LV EF: 56%.  Heart Catheterization: None  24 hour Holter monitor:  Dominant rhythm normal sinus rhythm.  Heart rate 45-112 bpm. Average heart rate 66 bpm.  No atrial fibrillation/ sinus pause greater than or equal to 2.5 seconds in duration.  Ventricular ectopy was 15 beats, with 1 ventricular pairs and 0 ventricular runs, total ventricular ectopic burden 0.01%.  Supraventricular ectopy was a 71 beats, with 5 supraventricular runs. The longest and the fastest supraventricular run was 3 beats in duration 115 bpm.  Total supraventricular ectopic burden 0.07%.  Number of patient triggered events: 1, which correlated to normal sinus rhythm.  LABORATORY DATA: External Labs: Collected: May 28, 2019 Creatinine 0.71 mg/dL. eGFR: 81 mL/min per 1.73 m TSH: 1.31  FINAL MEDICATION LIST END OF ENCOUNTER: Meds ordered this encounter  Medications  . metoprolol succinate (TOPROL XL) 25 MG 24 hr tablet    Sig: Take 1 tablet (25 mg total) by mouth daily. Hold if systolic blood pressure (top blood pressure number) less than 100 mmHg or heart rate less than 60 bpm (pulse).    Dispense:  90 tablet    Refill:  1    Medications Discontinued During This Encounter  Medication Reason  .  celecoxib (CELEBREX) 200 MG capsule Completed Course  . metoprolol succinate (TOPROL XL) 25 MG 24 hr tablet Reorder     Current Outpatient Medications:  .  atorvastatin (LIPITOR) 80 MG tablet, Take 40 mg by mouth daily., Disp: , Rfl:  .  Calcium Carb-Cholecalciferol 600-800 MG-UNIT CHEW, Chew 600 mg by mouth daily., Disp: , Rfl:  .  Dulaglutide (TRULICITY) 0.01 VC/9.4WH SOPN, Inject 0.75 mLs into the skin once a week., Disp: , Rfl:  .  enalapril (VASOTEC) 2.5 MG tablet, Take 2.5 mg by mouth daily., Disp: , Rfl:  .  furosemide (LASIX) 20 MG tablet, TAKE 1 TABLET (20 MG TOTAL) BY MOUTH IN THE MORNING., Disp: 90 tablet, Rfl: 2 .  Insulin Lispro Prot & Lispro (HUMALOG 75/25 MIX) (75-25) 100 UNIT/ML Kwikpen, As directed., Disp: , Rfl:  .  levothyroxine (SYNTHROID, LEVOTHROID) 125 MCG tablet, Take 125 mcg by mouth daily before breakfast., Disp: , Rfl:  .  loratadine (CLARITIN) 10 MG tablet, Take 10 mg by mouth as needed., Disp: , Rfl:  .  Magnesium 400 MG CAPS, Take 1 capsule by mouth daily., Disp: , Rfl:  .  metFORMIN (GLUCOPHAGE) 1000 MG tablet, Take 1,000 mg by mouth 2 (two) times daily with a meal., Disp: , Rfl:  .  Multiple Vitamins-Minerals (MULTIVITAMIN WITH MINERALS) tablet, Take 1 tablet by mouth daily., Disp: , Rfl:  .  metoprolol succinate (TOPROL XL) 25 MG 24 hr tablet, Take 1 tablet (25 mg total) by mouth daily. Hold if systolic blood pressure (top blood pressure number) less than 100 mmHg or heart rate less than 60 bpm (pulse)., Disp: 90 tablet, Rfl: 1  IMPRESSION:    ICD-10-CM   1. Palpitations  R00.2 EKG 12-Lead    metoprolol succinate (TOPROL XL) 25 MG 24 hr tablet  2. Benign hypertension  I10   3. Leg swelling  M79.89   4. Type 2 diabetes mellitus with diabetic neuropathy, with long-term current use of insulin (HCC)  E11.40    Z79.4   5. Long-term insulin use (HCC)  Z79.4   6. Mixed hyperlipidemia  E78.2   7. Former smoker  Z87.891   37. PVC (premature ventricular  contraction)  I49.3   9. Class 1 obesity due to excess calories with serious comorbidity and body mass index (BMI) of 31.0 to 31.9 in adult  E66.09    Z68.31      RECOMMENDATIONS: Laura Holland is a 75 y.o. female whose past medical history and cardiac risk factors include: Insulin-dependent diabetes mellitus type 2, hypertension, hyperlipidemia, postmenopausal female, advanced age, former smoker, obesity.  Palpitations: Stable.  History of PVCs.  Recent 24-hour Holter monitor noted no significant dysrhythmias.  Continue AV nodal blocking agents.    Refilled Toprol-XL.  EKG independently reviewed and interpreted.  Findings noted above.  No additional work-up needed at this time.  Lower extremity swelling: Resolved.  Continue Lasix as prescribed.  Patient is following a low-salt diet.  Insulin-dependent diabetes mellitus type 2: Currently managed by primary  care provider.  Mixed hyperlipidemia . Continue statin therapy.   . Follow lipids. . Currently managed by primary care provider. . Patient denies myalgia or other side effects.  Former smoker: Educated on the importance of continued smoking cessation.   Orders Placed This Encounter  Procedures  . EKG 12-Lead   --Continue cardiac medications as reconciled in final medication list. --Return in about 1 year (around 03/17/2021) for Follow up palpitations. . Or sooner if needed. --Continue follow-up with your primary care physician regarding the management of your other chronic comorbid conditions.  Patient's questions and concerns were addressed to her and her husband's satisfaction. She voices understanding of the instructions provided during this encounter.   This note was created using a voice recognition software as a result there may be grammatical errors inadvertently enclosed that do not reflect the nature of this encounter. Every attempt is made to correct such errors.  Rex Kras, [place], [place]  Pager:  [phone] Office: [phone]

## 2020-03-27 ENCOUNTER — Other Ambulatory Visit: Payer: Self-pay | Admitting: Otolaryngology

## 2020-03-27 DIAGNOSIS — H918X9 Other specified hearing loss, unspecified ear: Secondary | ICD-10-CM

## 2020-03-27 DIAGNOSIS — H918X3 Other specified hearing loss, bilateral: Secondary | ICD-10-CM

## 2020-04-10 ENCOUNTER — Ambulatory Visit
Admission: RE | Admit: 2020-04-10 | Discharge: 2020-04-10 | Disposition: A | Discharge status: Home / Self Care | Payer: Medicare Other | Source: Ambulatory Visit | Attending: Otolaryngology | Admitting: Otolaryngology

## 2020-04-10 ENCOUNTER — Other Ambulatory Visit: Payer: Self-pay

## 2020-04-10 DIAGNOSIS — H918X9 Other specified hearing loss, unspecified ear: Secondary | ICD-10-CM

## 2020-04-10 IMAGING — MR MR HEAD WO/W CM
12 series · 38 of 48 positions shown · IV contrast (multihance)
Comparison: None.

CLINICAL DATA: Right-sided hearing loss with tinnitus

EXAM:
MRI HEAD WITHOUT AND WITH CONTRAST
TECHNIQUE: Multiplanar, multiecho pulse sequences of the brain and surrounding
structures were obtained without and with intravenous contrast.
CONTRAST:  19mL MULTIHANCE GADOBENATE DIMEGLUMINE 529 MG/ML IV SOLN

[Series 2: T1 · sagittal · 5.0mm · 0.45mm/px · 3 of 21 slices shown (1 of 3)]
[im 1/21]
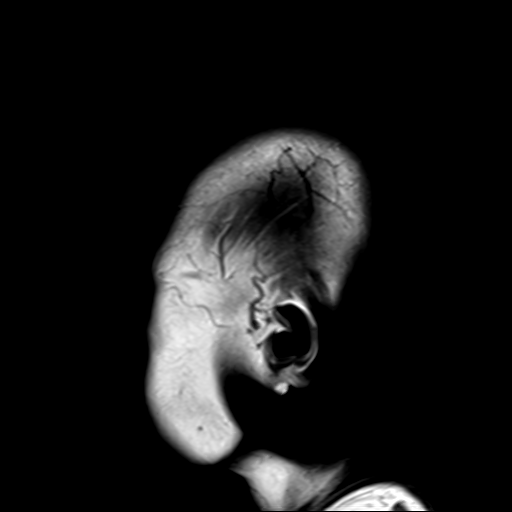
[im 11/21]
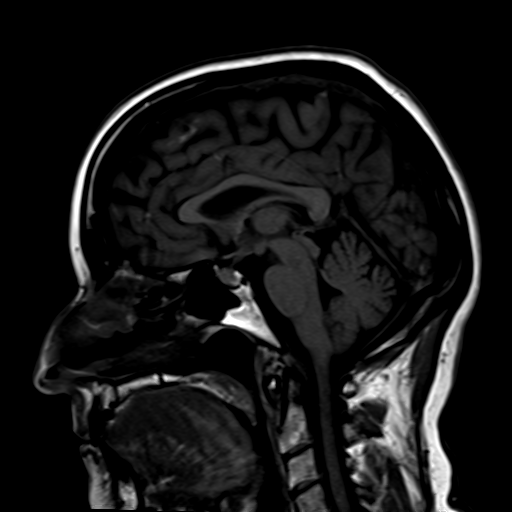
[im 21/21]
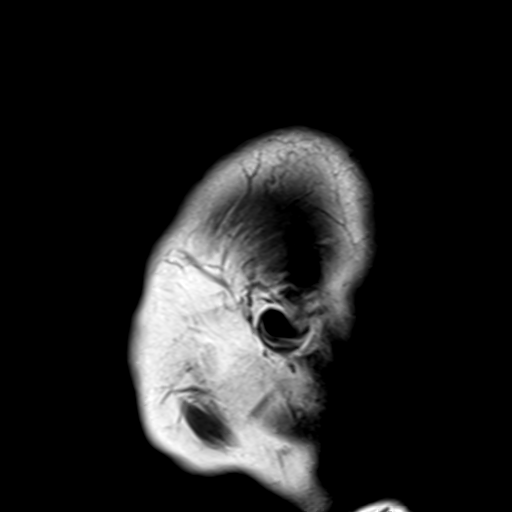

[Series 3: DWI · axial · 3.0mm · 1.80mm/px · z∈[-40,+106]mm · 9 of 100 slices shown (1 of 2)]
[im 1/100]
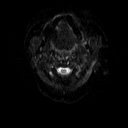
[im 19/100]
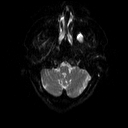
[im 28/100]
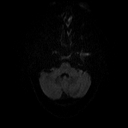
[im 46/100]
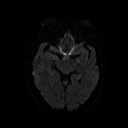
[im 55/100]
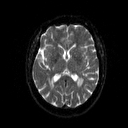
[im 73/100]
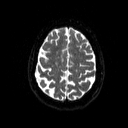
[im 82/100]
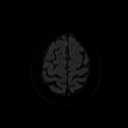
[im 91/100]
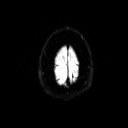
[im 100/100]
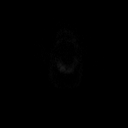

[Series 4: DWI · axial · 3.0mm · 1.80mm/px · z∈[-40,+106]mm · 5 of 47 slices shown (2 of 2)]
[im 1/47]
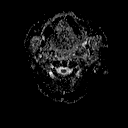
[im 12/47]
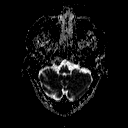
[im 24/47]
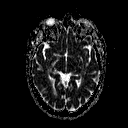
[im 35/47]
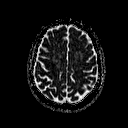
[im 47/47]
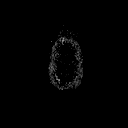

[Series 5: T2 · axial · 5.0mm · 0.60mm/px · z∈[-40,+102]mm · 3 of 23 slices shown]
[im 1/23]
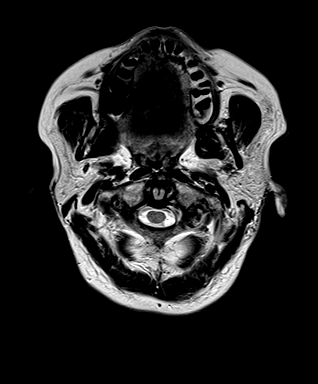
[im 12/23]
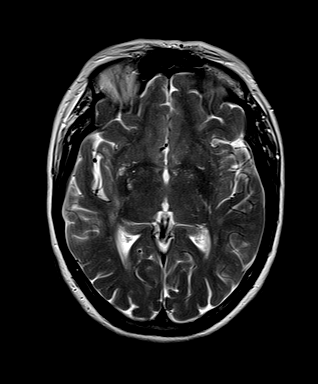
[im 23/23]
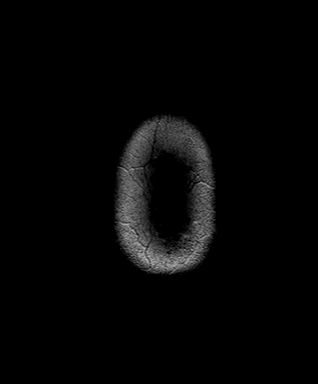

[Series 6: FLAIR · axial · 3.0mm · 0.45mm/px · z∈[-45,+109]mm · 3 of 27 slices shown]
[im 1/27]
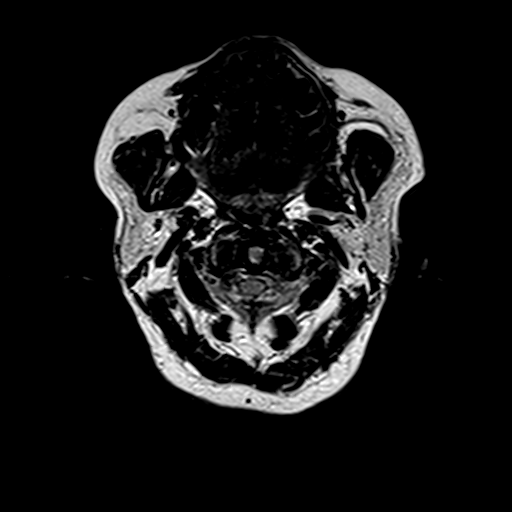
[im 14/27]
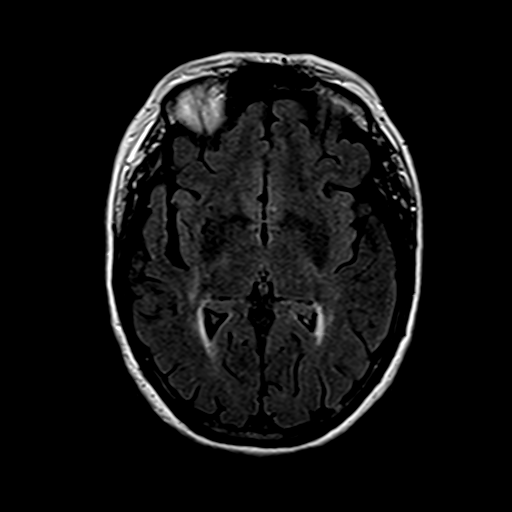
[im 27/27]
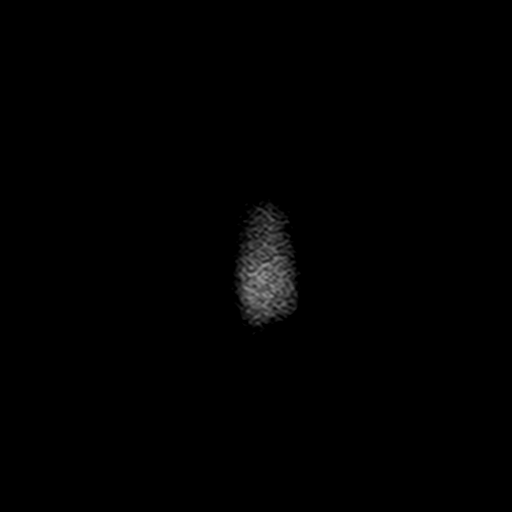

[Series 8: swi_images · axial · 3.0mm · 0.90mm/px · z∈[-39,+101]mm · 6 of 48 slices shown]
[im 1/48]
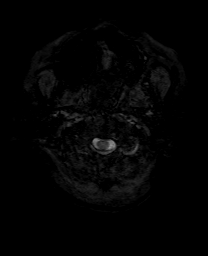
[im 10/48]
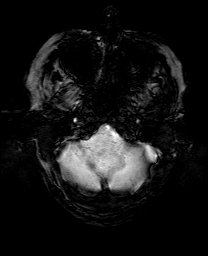
[im 19/48]
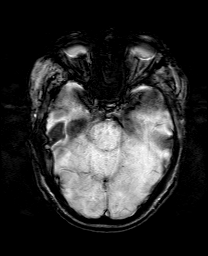
[im 29/48]
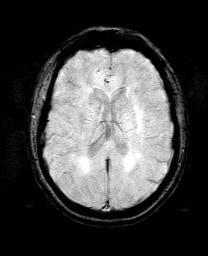
[im 38/48]
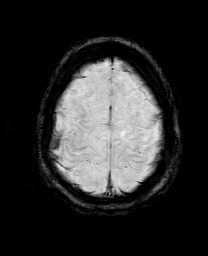
[im 48/48]
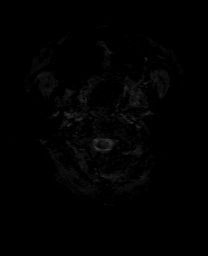

[Series 9: T1 · coronal · 3.0mm · 0.35mm/px · 1 of 11 slices shown (2 of 3)]
[im 1/11]
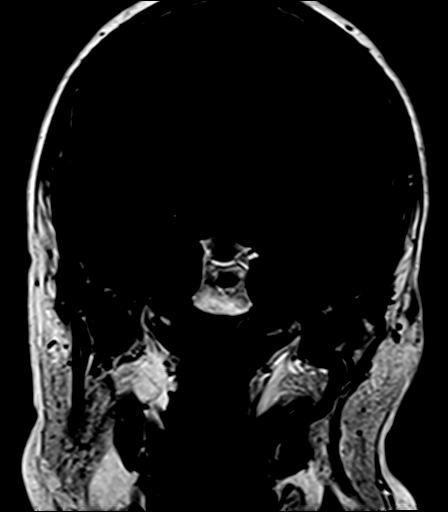

[Series 10: T1 · axial · 3.0mm · 0.35mm/px · 1 of 11 slices shown (3 of 3)]
[im 1/11]
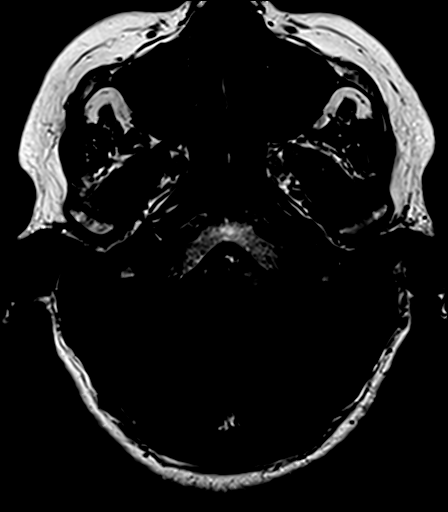

[Series 11: bSSFP · axial · 1.0mm · 0.28mm/px · z∈[-8,+27]mm · 4 of 36 slices shown]
[im 1/36]
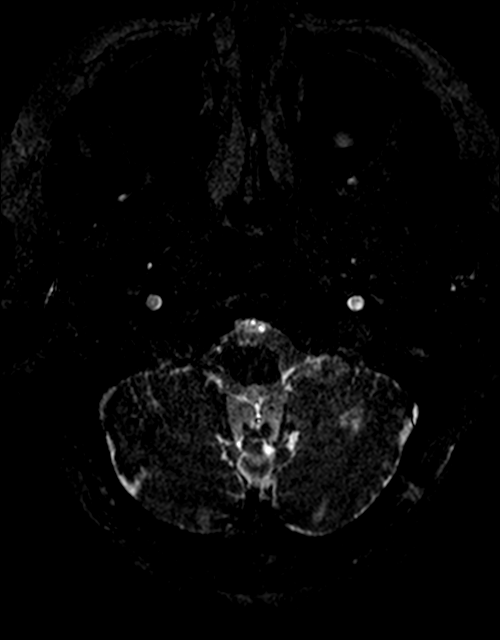
[im 12/36]
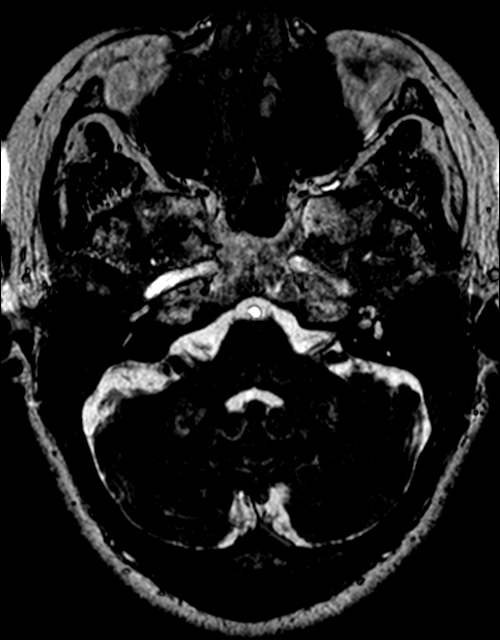
[im 24/36]
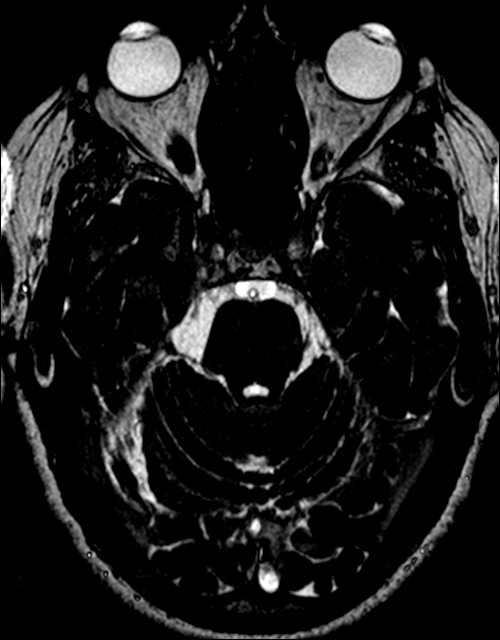
[im 36/36]
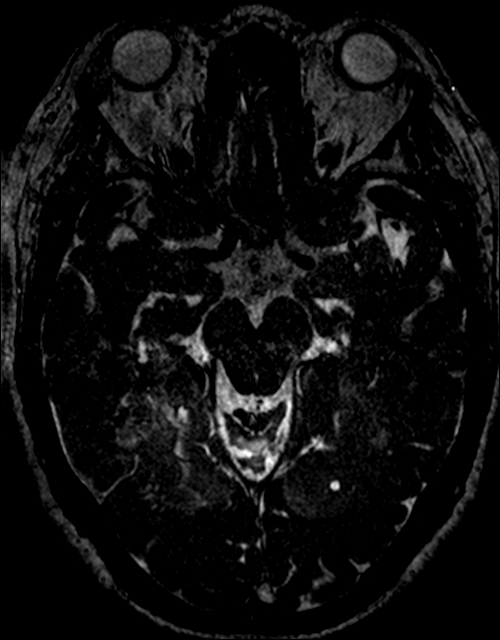

[Series 12: T1 post-contrast · coronal · 3.0mm · 0.35mm/px · 1 of 11 slices shown (1 of 2)]
[im 1/11]
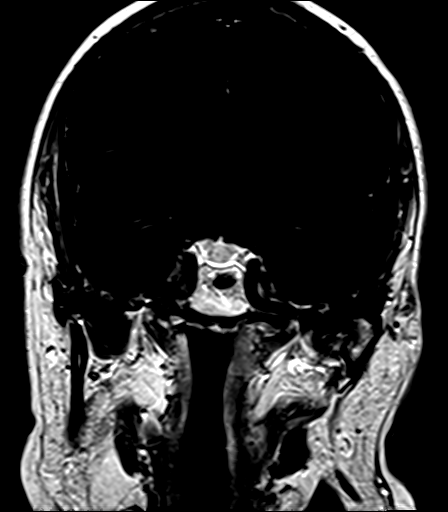

[Series 13: T1 post-contrast · axial · 3.0mm · 0.35mm/px · 1 of 11 slices shown (2 of 2)]
[im 1/11]
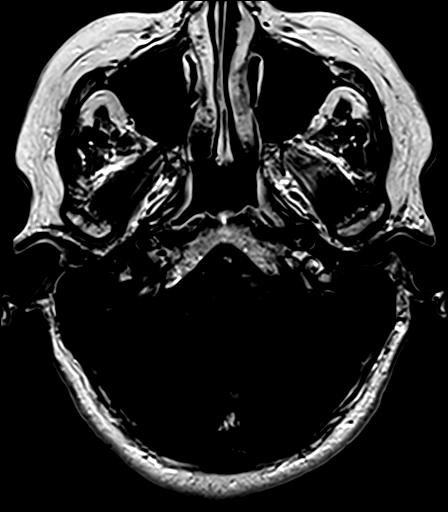

[Series 14: post_t1_mpr_tra · axial · 2.0mm · 0.45mm/px · 1 of 72 slices shown]
[im 1/72]
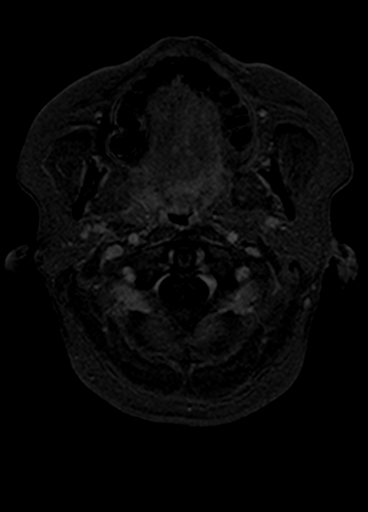

[38 of 48 positions shown; findings below may reference images not displayed]

FINDINGS: Brain: No acute infarct, mass effect or extra-axial collection. No
acute or chronic hemorrhage. Hyperintense T2-weighted signal is
moderately widespread throughout the white matter. Parenchymal
volume and CSF spaces are normal. The midline structures are normal.
There is no cerebellopontine angle mass. The cochleae and
semicircular canals are normal. No focal abnormality along the
course of the 7th and 8th cranial nerves. Normal porus acusticus and
vestibular aqueduct bilaterally.

Vascular: Major flow voids are preserved.

Skull and upper cervical spine: Normal calvarium and skull base.
Visualized upper cervical spine and soft tissues are normal.

Sinuses/Orbits:No paranasal sinus fluid levels or advanced mucosal
thickening. No mastoid or middle ear effusion. Normal orbits.
IMPRESSION: 1. Normal MRI of the internal auditory canals and inner ear
structures.
2. No acute intracranial abnormality.
3. Findings of chronic microvascular ischemia.

## 2020-04-10 MED ORDER — GADOBENATE DIMEGLUMINE 529 MG/ML IV SOLN
19.0000 mL | Freq: Once | INTRAVENOUS | Status: AC | PRN
  Administered 2020-04-10: 19 mL via INTRAVENOUS

## 2020-07-04 ENCOUNTER — Telehealth: Payer: Self-pay | Admitting: Orthopaedic Surgery

## 2020-07-04 NOTE — Telephone Encounter (Cosign Needed)
Patient would like to move forward with carpal tunnel surgery.  Please provide surgery sheet.  Thanks

## 2020-07-08 ENCOUNTER — Telehealth: Payer: Self-pay | Admitting: Orthopaedic Surgery

## 2020-07-08 NOTE — Telephone Encounter (Signed)
4 weeks of recovery.  4 weeks of no lifting more than 10 lbs.

## 2020-07-08 NOTE — Telephone Encounter (Signed)
Left surgery sheet on your door.

## 2020-07-08 NOTE — Telephone Encounter (Signed)
Patient is scheduled for right carpal tunnel release on 07-17-20 at [facility] with Dr. Erlinda Hong.  She is asking what the down time is on this procedure and what  her limitations will be.    Pt's cb  [phone]

## 2020-07-09 NOTE — Telephone Encounter (Signed)
Patient aware.

## 2020-07-15 ENCOUNTER — Other Ambulatory Visit: Payer: Self-pay | Admitting: Physician Assistant

## 2020-07-15 MED ORDER — HYDROCODONE-ACETAMINOPHEN 5-325 MG PO TABS: 1.0000 | ORAL_TABLET | Freq: Three times a day (TID) | ORAL | 0 refills | Status: DC | PRN

## 2020-07-15 MED ORDER — ONDANSETRON HCL 4 MG PO TABS: 4.0000 mg | ORAL_TABLET | Freq: Three times a day (TID) | ORAL | 0 refills | Status: DC | PRN

## 2020-07-17 DIAGNOSIS — G5601 Carpal tunnel syndrome, right upper limb: Secondary | ICD-10-CM | POA: Diagnosis not present

## 2020-07-18 ENCOUNTER — Telehealth: Payer: Self-pay

## 2020-07-18 NOTE — Telephone Encounter (Signed)
Patient called stating that her fingers are still numb from her right hand surgery on Thursday, 07/17/2020 and how long should her fingers stay numb?  Cb# [phone].  Please advise.  Thank you.

## 2020-07-18 NOTE — Telephone Encounter (Signed)
Called patient.  She's not reporting any neurovascular compromise.  Just numbness in all 5 fingertips.  Better today than yesterday.  Recommended that she continue with elevation and watch for any worsening.  She has appointment to see me next week.

## 2020-07-18 NOTE — Telephone Encounter (Signed)
Please advise 

## 2020-07-24 ENCOUNTER — Ambulatory Visit (INDEPENDENT_AMBULATORY_CARE_PROVIDER_SITE_OTHER): Payer: Medicare Other | Admitting: Physician Assistant

## 2020-07-24 ENCOUNTER — Encounter: Payer: Self-pay | Admitting: Orthopaedic Surgery

## 2020-07-24 ENCOUNTER — Other Ambulatory Visit: Payer: Self-pay

## 2020-07-24 VITALS — Ht 67.0 in | Wt 202.0 lb

## 2020-07-24 DIAGNOSIS — G5601 Carpal tunnel syndrome, right upper limb: Secondary | ICD-10-CM

## 2020-07-24 NOTE — Progress Notes (Signed)
   Post-Op Visit Note   Patient: Laura Holland           Date of Birth: 05-17-1945           MRN: [id number] Visit Date: 07/24/2020 PCP: Carol Ada, MD   Assessment & Plan:  Chief Complaint:  Chief Complaint  Patient presents with  . Right Wrist - Follow-up, Routine Post Op    Right carpal tunnel release 07/17/2020   Visit Diagnoses:  1. Right carpal tunnel syndrome     Plan: Patient is a pleasant 75 year old female who comes in today 1 week out right carpal tunnel release for moderate compression median nerve.  She has been doing okay.  He has been in mild pain.  She does note increased numbness to the right hand since surgery.  This has however improved as each postoperative day is by.  Examination of her right hand reveals a well healing surgical incision with nylon sutures in place.  No evidence of infection or cellulitis.  Decree sensation to the right hand.  Motor function intact.  Today, the wound was cleaned and covered.  Removable wrist splint provided.  No heavy lifting or submerging her head underwater for 3 more weeks.  She will follow-up with [place] next week for suture removal  Follow-Up Instructions: Return in about 1 week (around 07/31/2020).   Orders:  No orders of the defined types were placed in this encounter.  No orders of the defined types were placed in this encounter.   Imaging: No new imaging  PMFS History: Patient Active Problem List   Diagnosis Date Noted  . Right carpal tunnel syndrome 12/11/2019  . 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 Diabetes mellitus without complication ([place])   . High cholesterol   . Hypertension   . Hypothyroid     Family History  Problem Relation Age of Onset  . Arthritis Mother   . Breast cancer Mother   . Thyroid cancer Mother   . Lung cancer Father   . Hypertension Father   . Hyperlipidemia Father   . Lung cancer Sister   . Breast  cancer Sister   . Heart attack Sister   . Hypertension Brother   . Hyperlipidemia Brother     Past Surgical History:  Procedure Laterality Date  . APPENDECTOMY    . CATARACT EXTRACTION, BILATERAL  2020  . CESAREAN SECTION    . CHOLECYSTECTOMY    . dental implants  08/2019  . LAMINECTOMY     Social History   Occupational History  . Not on file  Tobacco Use  . Smoking status: Former Smoker    Types: Cigarettes    Quit date: 1965    Years since quitting: 57.4  . Smokeless tobacco: Never Used  Vaping Use  . Vaping Use: Never used  Substance and Sexual Activity  . Alcohol use: Not Currently    Comment: 20 years without alcohol   . Drug use: Never  . Sexual activity: Not on file

## 2020-07-25 ENCOUNTER — Encounter: Payer: Medicare Other | Admitting: Orthopaedic Surgery

## 2020-07-27 ENCOUNTER — Other Ambulatory Visit: Payer: Self-pay | Admitting: Cardiology

## 2020-07-27 DIAGNOSIS — M7989 Other specified soft tissue disorders: Secondary | ICD-10-CM

## 2020-08-01 ENCOUNTER — Ambulatory Visit (INDEPENDENT_AMBULATORY_CARE_PROVIDER_SITE_OTHER): Payer: Medicare Other | Admitting: Orthopaedic Surgery

## 2020-08-01 ENCOUNTER — Encounter: Payer: Self-pay | Admitting: Orthopaedic Surgery

## 2020-08-01 VITALS — Ht 67.0 in | Wt 202.0 lb

## 2020-08-01 DIAGNOSIS — G5601 Carpal tunnel syndrome, right upper limb: Secondary | ICD-10-CM

## 2020-08-01 NOTE — Progress Notes (Signed)
   Post-Op Visit Note   Patient: Laura Holland           Date of Birth: 1945-04-06           MRN: [id number] Visit Date: 08/01/2020 PCP: Laura Ada, MD   Assessment & Plan:  Chief Complaint:  Chief Complaint  Patient presents with  . Right Wrist - Follow-up    Right carpal tunnel release 07/17/2020   Visit Diagnoses:  1. Right carpal tunnel syndrome     Plan:   [name] is 2-week status post right carpal tunnel release.  Overall feeling better and sleeping better.  Still has some mild residual numbness in the index long and ring fingers.  No neurovascular compromise to the right hand.  Surgical incision is healed.  She can oppose the thumb tip to the fifth metacarpal head.  Sutures removed today and Steri-Strips applied.  She should wear the wrist brace during activity for another couple weeks as well as limit to less than 10 pounds of lifting.  Recheck in 6 weeks.  Follow-Up Instructions: Return in about 6 weeks (around 09/12/2020).   Orders:  No orders of the defined types were placed in this encounter.  No orders of the defined types were placed in this encounter.   Imaging: No results found.  PMFS History: Patient Active Problem List   Diagnosis Date Noted  . Right carpal tunnel syndrome 12/11/2019  . 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 Diabetes mellitus without complication ([place])   . High cholesterol   . Hypertension   . Hypothyroid     Family History  Problem Relation Age of Onset  . Arthritis Mother   . Breast cancer Mother   . Thyroid cancer Mother   . Lung cancer Father   . Hypertension Father   . Hyperlipidemia Father   . Lung cancer Sister   . Breast cancer Sister   . Heart attack Sister   . Hypertension Brother   . Hyperlipidemia Brother     Past Surgical History:  Procedure Laterality Date  . APPENDECTOMY    . CATARACT EXTRACTION, BILATERAL  2020  . CESAREAN  SECTION    . CHOLECYSTECTOMY    . dental implants  08/2019  . LAMINECTOMY     Social History   Occupational History  . Not on file  Tobacco Use  . Smoking status: Former Smoker    Types: Cigarettes    Quit date: 1965    Years since quitting: 57.4  . Smokeless tobacco: Never Used  Vaping Use  . Vaping Use: Never used  Substance and Sexual Activity  . Alcohol use: Not Currently    Comment: 20 years without alcohol   . Drug use: Never  . Sexual activity: Not on file

## 2020-08-24 ENCOUNTER — Other Ambulatory Visit: Payer: Self-pay

## 2020-08-24 ENCOUNTER — Emergency Department (HOSPITAL_BASED_OUTPATIENT_CLINIC_OR_DEPARTMENT_OTHER)
Admission: EM | Admit: 2020-08-24 | Discharge: 2020-08-24 | Disposition: A | Discharge status: Home / Self Care | Payer: Medicare Other | Attending: Emergency Medicine | Admitting: Emergency Medicine

## 2020-08-24 ENCOUNTER — Emergency Department (HOSPITAL_BASED_OUTPATIENT_CLINIC_OR_DEPARTMENT_OTHER): Payer: Medicare Other

## 2020-08-24 ENCOUNTER — Encounter (HOSPITAL_BASED_OUTPATIENT_CLINIC_OR_DEPARTMENT_OTHER): Payer: Self-pay | Admitting: Emergency Medicine

## 2020-08-24 DIAGNOSIS — R1032 Left lower quadrant pain: Secondary | ICD-10-CM | POA: Diagnosis not present

## 2020-08-24 DIAGNOSIS — R110 Nausea: Secondary | ICD-10-CM | POA: Diagnosis not present

## 2020-08-24 DIAGNOSIS — Z794 Long term (current) use of insulin: Secondary | ICD-10-CM | POA: Diagnosis not present

## 2020-08-24 DIAGNOSIS — Z79899 Other long term (current) drug therapy: Secondary | ICD-10-CM | POA: Insufficient documentation

## 2020-08-24 DIAGNOSIS — E119 Type 2 diabetes mellitus without complications: Secondary | ICD-10-CM | POA: Diagnosis not present

## 2020-08-24 DIAGNOSIS — I10 Essential (primary) hypertension: Secondary | ICD-10-CM | POA: Insufficient documentation

## 2020-08-24 DIAGNOSIS — E039 Hypothyroidism, unspecified: Secondary | ICD-10-CM | POA: Diagnosis not present

## 2020-08-24 DIAGNOSIS — Z7984 Long term (current) use of oral hypoglycemic drugs: Secondary | ICD-10-CM | POA: Diagnosis not present

## 2020-08-24 DIAGNOSIS — R21 Rash and other nonspecific skin eruption: Secondary | ICD-10-CM | POA: Diagnosis not present

## 2020-08-24 DIAGNOSIS — R197 Diarrhea, unspecified: Secondary | ICD-10-CM | POA: Insufficient documentation

## 2020-08-24 DIAGNOSIS — Z87891 Personal history of nicotine dependence: Secondary | ICD-10-CM | POA: Diagnosis not present

## 2020-08-24 DIAGNOSIS — M25552 Pain in left hip: Secondary | ICD-10-CM | POA: Diagnosis present

## 2020-08-24 DIAGNOSIS — R630 Anorexia: Secondary | ICD-10-CM | POA: Diagnosis not present

## 2020-08-24 LAB — CBC WITH DIFFERENTIAL/PLATELET
Abs Immature Granulocytes: 0.01 10*3/uL (ref 0.00–0.07)
Basophils Absolute: 0 10*3/uL (ref 0.0–0.1)
Basophils Relative: 1 %
Eosinophils Absolute: 0.3 10*3/uL (ref 0.0–0.5)
Eosinophils Relative: 4 %
HCT: 36.1 % (ref 36.0–46.0)
Hemoglobin: 12.3 g/dL (ref 12.0–15.0)
Immature Granulocytes: 0 %
Lymphocytes Relative: 29 %
Lymphs Abs: 1.6 10*3/uL (ref 0.7–4.0)
MCH: 31.1 pg (ref 26.0–34.0)
MCHC: 34.1 g/dL (ref 30.0–36.0)
MCV: 91.2 fL (ref 80.0–100.0)
Monocytes Absolute: 0.5 10*3/uL (ref 0.1–1.0)
Monocytes Relative: 8 %
Neutro Abs: 3.3 10*3/uL (ref 1.7–7.7)
Neutrophils Relative %: 58 %
Platelets: 189 10*3/uL (ref 150–400)
RBC: 3.96 MIL/uL (ref 3.87–5.11)
RDW: 13 % (ref 11.5–15.5)
WBC: 5.7 10*3/uL (ref 4.0–10.5)
nRBC: 0 % (ref 0.0–0.2)

## 2020-08-24 LAB — COMPREHENSIVE METABOLIC PANEL
ALT: 15 U/L (ref 0–44)
AST: 16 U/L (ref 15–41)
Albumin: 3.8 g/dL (ref 3.5–5.0)
Alkaline Phosphatase: 80 U/L (ref 38–126)
Anion gap: 7 (ref 5–15)
BUN: 15 mg/dL (ref 8–23)
CO2: 30 mmol/L (ref 22–32)
Calcium: 9.1 mg/dL (ref 8.9–10.3)
Chloride: 102 mmol/L (ref 98–111)
Creatinine, Ser: 0.83 mg/dL (ref 0.44–1.00)
GFR, Estimated: >60 mL/min (ref >60)
Glucose, Bld: 191 mg/dL — ABNORMAL HIGH (ref 70–99)
Potassium: 3.5 mmol/L (ref 3.5–5.1)
Sodium: 139 mmol/L (ref 135–145)
Total Bilirubin: 1 mg/dL (ref 0.3–1.2)
Total Protein: 6.7 g/dL (ref 6.5–8.1)

## 2020-08-24 LAB — URINALYSIS, ROUTINE W REFLEX MICROSCOPIC
Bilirubin Urine: NEGATIVE
Glucose, UA: NEGATIVE mg/dL
Hgb urine dipstick: NEGATIVE
Ketones, ur: NEGATIVE mg/dL
Nitrite: NEGATIVE
Protein, ur: NEGATIVE mg/dL
Specific Gravity, Urine: 1.025 (ref 1.005–1.030)
pH: 5.5 (ref 5.0–8.0)

## 2020-08-24 LAB — URINALYSIS, MICROSCOPIC (REFLEX): RBC / HPF: NONE SEEN RBC/hpf (ref 0–5)

## 2020-08-24 LAB — LIPASE, BLOOD: Lipase: 37 U/L (ref 11–51)

## 2020-08-24 IMAGING — CT CT ABD-PELV W/ CM
2 of 5 series · 16 of 46 positions shown, 18 images · IV contrast (Omnipaque)
Comparison: None.

CLINICAL DATA: Left lower quadrant pain radiating to the leg.

EXAM:
CT ABDOMEN AND PELVIS WITH CONTRAST
TECHNIQUE: Multidetector CT imaging of the abdomen and pelvis was performed
using the standard protocol following bolus administration of
intravenous contrast.
CONTRAST:  100mL OMNIPAQUE IOHEXOL 300 MG/ML  SOLN

[Series 2: axial st · axial · 0.84mm/px · z∈[-446,-66]mm · 13 of 85 slices shown, 15 images]
[im 5/85  soft-tissue]
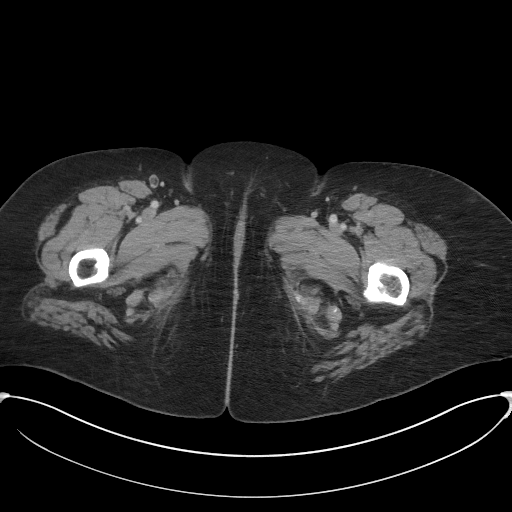
[im 5/85  bone]
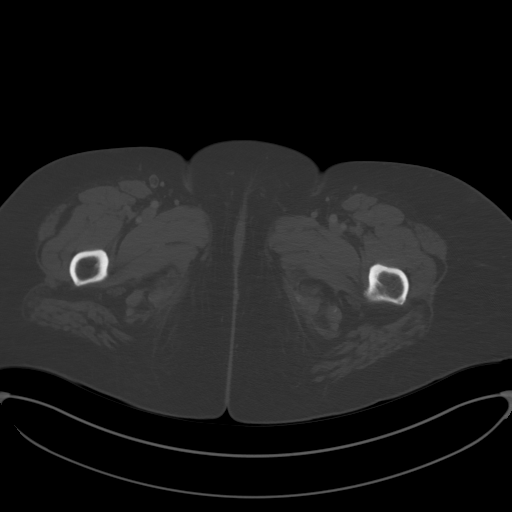
[im 13/85  soft-tissue]
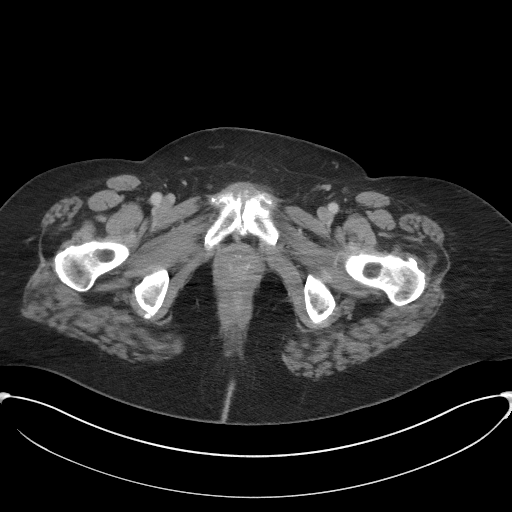
[im 17/85  soft-tissue]
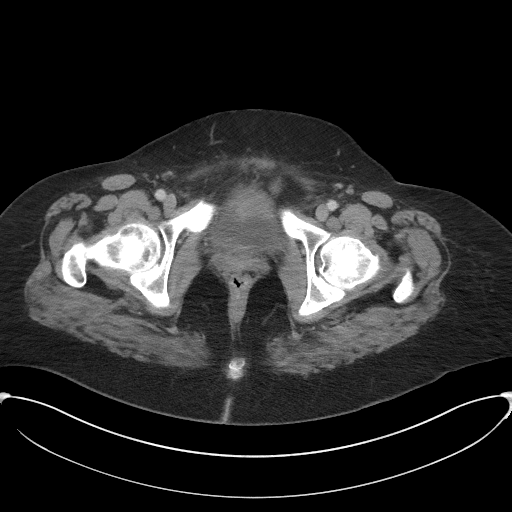
[im 25/85  soft-tissue]
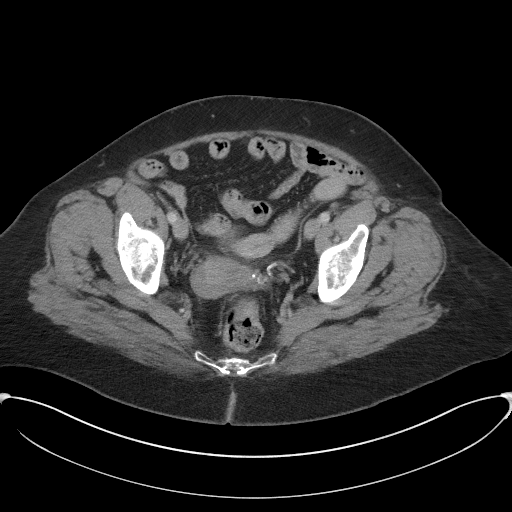
[im 29/85  soft-tissue]
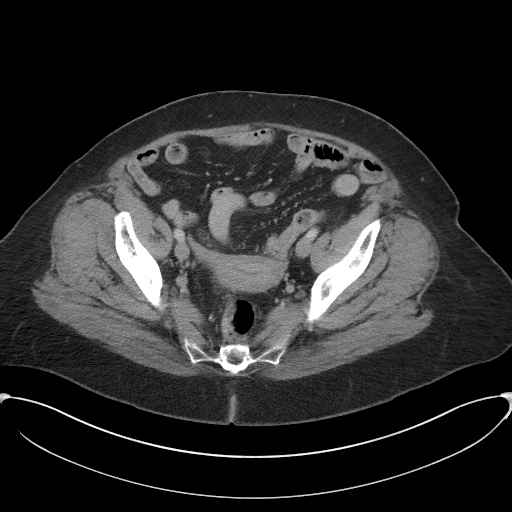
[im 37/85  soft-tissue]
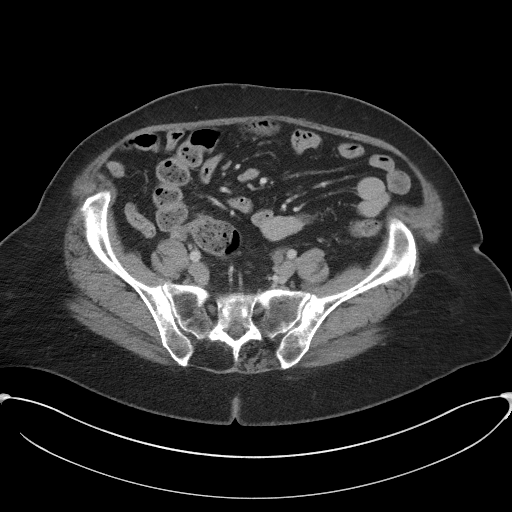
[im 45/85  soft-tissue]
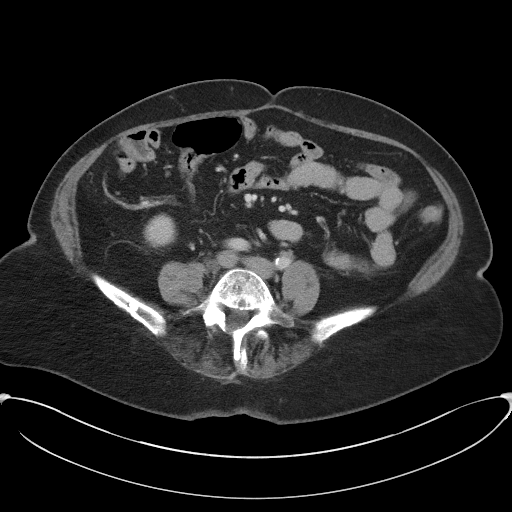
[im 49/85  soft-tissue]
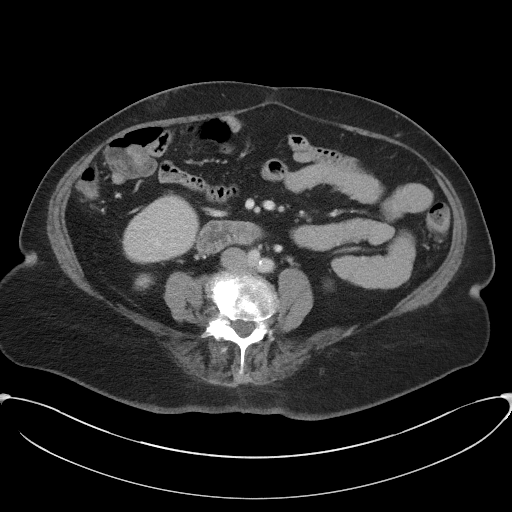
[im 57/85  soft-tissue]
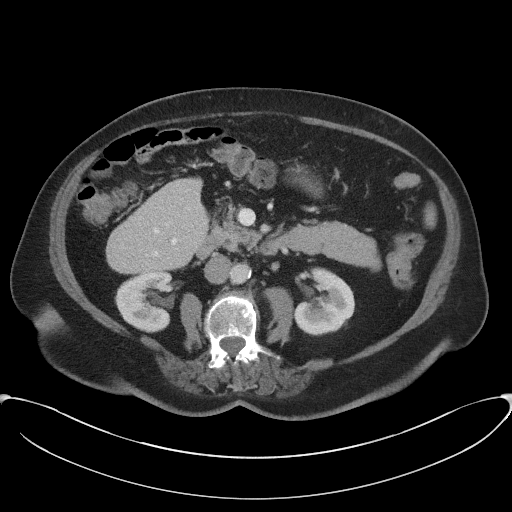
[im 57/85  bone]
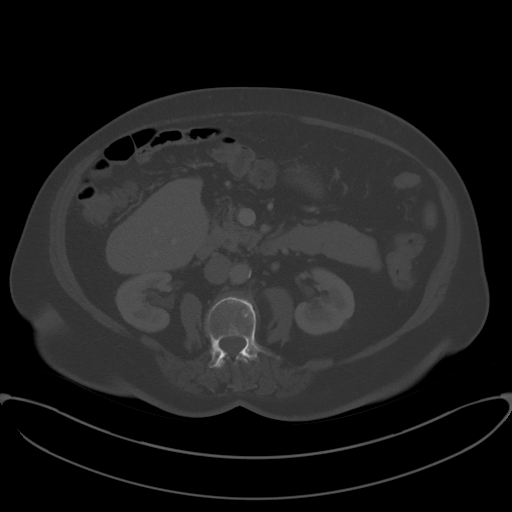
[im 61/85  soft-tissue]
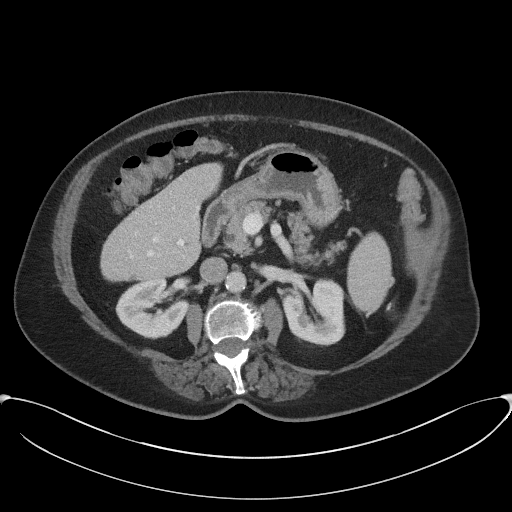
[im 69/85  soft-tissue]
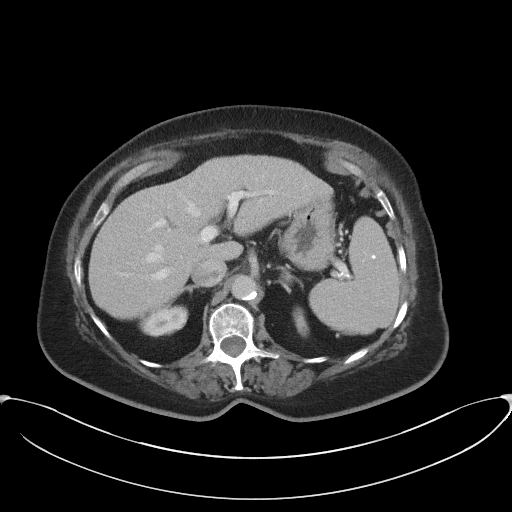
[im 73/85  soft-tissue]
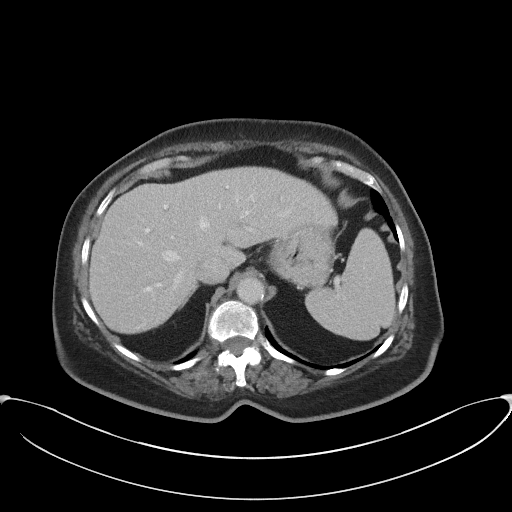
[im 81/85  soft-tissue]
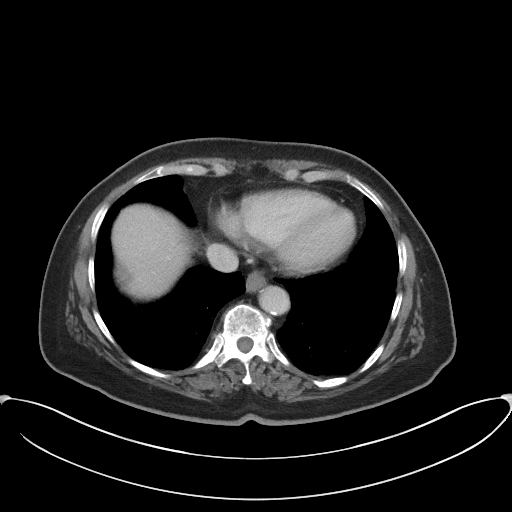

[Series 5: coronal st · coronal · 0.89mm/px · 3 of 97 slices shown]
[im 33/97  soft-tissue]
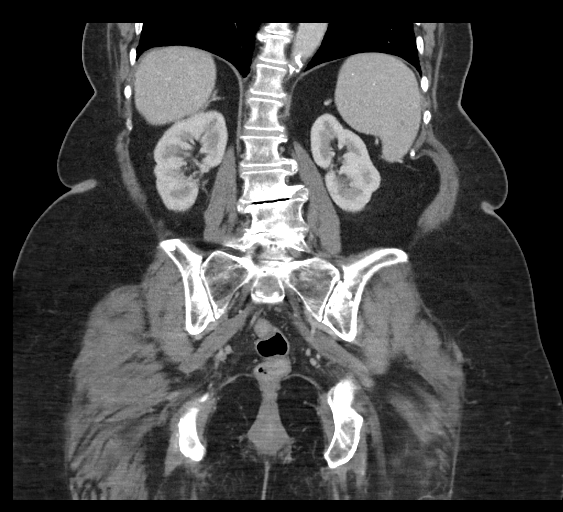
[im 43/97  soft-tissue]
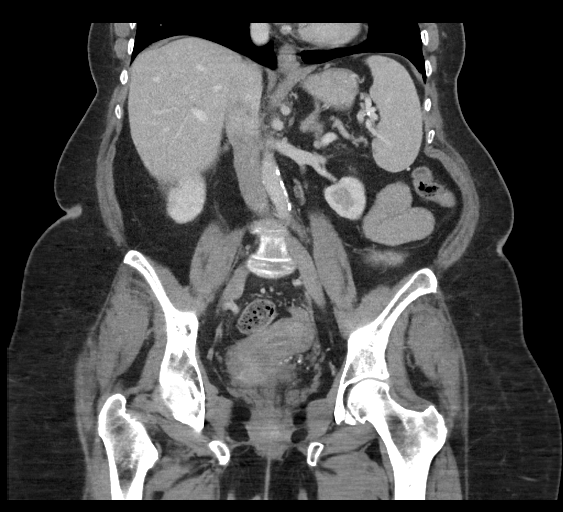
[im 54/97  soft-tissue]
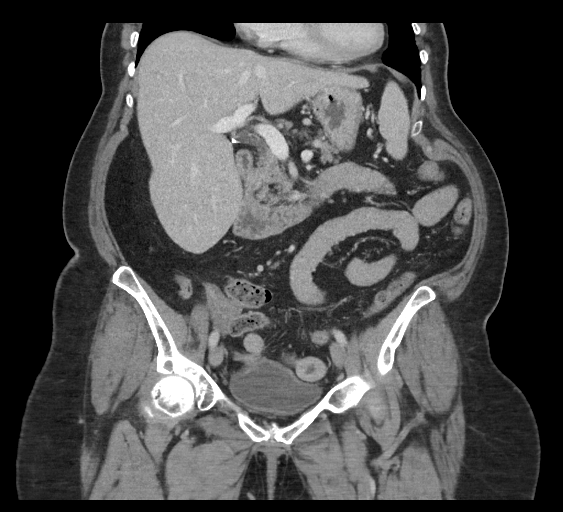

[16 of 46 positions shown; findings below may reference images not displayed]

FINDINGS: Lower chest: Normal heart size. Dependent atelectasis within the
bilateral lower lobes. Calcified granuloma within the lingula. No
pleural effusion.

Hepatobiliary: Liver is normal in size and contour. Prior
cholecystectomy. No intrahepatic or extrahepatic biliary ductal
dilatation.

Pancreas: Suggestion of a 9 mm cystic lesion within the pancreatic
tail (image 47; series 5).

Spleen: Multiple calcified granulomas.

Adrenals/Urinary Tract: Normal adrenal glands. Kidneys enhance
symmetrically with contrast. There is a 2.1 cm partially exophytic
lesion off of the superior pole of the left kidney (image 22; series
2) with an internal density of 30 Hounsfield units, indeterminate.
Urinary bladder is unremarkable.

Stomach/Bowel: No abnormal bowel wall thickening or evidence for
bowel obstruction. No free fluid or free intraperitoneal air.
Morphology of the stomach.

Vascular/Lymphatic: Normal caliber abdominal aorta. Peripheral
calcified atherosclerotic plaque. No retroperitoneal
lymphadenopathy.

Reproductive: Uterus and adnexal structures are unremarkable.

Other: None.

Musculoskeletal: Lower thoracic and lumbar spine degenerative
changes. Retrolisthesis of the L3 on L4 vertebral body with
narrowing of the canal. Degenerative changes at this level. No
aggressive or acute appearing osseous lesions.
IMPRESSION: There is an indeterminate exophytic lesion off of the superior pole
of the left kidney. Possibility of enhancing renal lesion not
excluded. This needs dedicated evaluation with pre and post
contrast-enhanced MRI in the non acute setting.

Suggestion of a 9 mm cystic lesion within the pancreatic tail. This
can be further evaluated with pre and post contrast-enhanced MRI as
recommended for the evaluation of the renal lesion as well.

Retrolisthesis of L3 on L4 with narrowing of the canal at this
level.

No acute process within the abdomen or pelvis.

## 2020-08-24 MED ORDER — IOHEXOL 300 MG/ML  SOLN
100.0000 mL | Freq: Once | INTRAMUSCULAR | Status: AC | PRN
  Administered 2020-08-24: 100 mL via INTRAVENOUS

## 2020-08-24 MED ORDER — FENTANYL CITRATE (PF) 100 MCG/2ML IJ SOLN
25.0000 ug | Freq: Once | INTRAMUSCULAR | Status: AC
  Administered 2020-08-24: 25 ug via INTRAVENOUS
  Filled 2020-08-24: qty 2

## 2020-08-24 MED ORDER — ONDANSETRON HCL 4 MG/2ML IJ SOLN
4.0000 mg | Freq: Once | INTRAMUSCULAR | Status: AC
  Administered 2020-08-24: 4 mg via INTRAVENOUS
  Filled 2020-08-24: qty 2

## 2020-08-24 MED ORDER — PREDNISONE 20 MG PO TABS: 40.0000 mg | ORAL_TABLET | Freq: Every day | ORAL | 0 refills | Status: DC

## 2020-08-24 NOTE — ED Notes (Signed)
Pt ambulatory with steady gait, standby assit to restroom, will provide urine specimen

## 2020-08-24 NOTE — ED Notes (Signed)
Patient transported to CT 

## 2020-08-24 NOTE — ED Notes (Signed)
ED Provider at bedside. 

## 2020-08-24 NOTE — ED Triage Notes (Signed)
Pt arrives pov with c/o LLQ pain radiating to left leg x 2 days.Pt denies injury endorses work in yard, bending over. Pt denies emesis, denies fever. Endorses ibuprofen yesterday with no relief

## 2020-08-24 NOTE — ED Provider Notes (Signed)
[facility] Provider Note   CSN: [id number] Arrival date & time: 08/24/20  2297     History Chief Complaint  Patient presents with   Abdominal Pain   Leg Pain    Laura Holland is a 75 y.o. female.  Patient is a 75 year old female with a history of diabetes, hypertension, hyperlipidemia, status post cholecystectomy and appendectomy who is presenting today with left lower quadrant abdominal pain and left upper leg pain.  Symptoms started 2 days ago and have gradually worsened.  She cannot think of any trauma or anything that she did that would have caused this pain.  She has been doing her normal activity but over the last 2 days she started having significant pain even when she tries to walk or stand on the left leg but the pain is also in the abdomen.  She has had some minimal nausea and decreased appetite as well.  She has not eaten anything since 3 PM yesterday and does not feel hungry.  She has not noticed any blood in her stool or urine.  No dysuria or frequency.  She always has diarrhea because she takes metformin but it does not seem that it has been any different.  She has not noticed any rashes or bites to the area.  She always has swelling in bilateral lower extremities which she takes Lasix for.  She is has noticed that more recently the swelling is not going down is much overnight as it used to.  The pain does not radiate down her leg or go into her foot.  She has no numbness or weakness.  She does have chronic back pain and is status postlaminectomy but is not having pain in her back at this time.  No recent long trips.  No prior history of DVT or PE.  She denies any shortness of breath, cough, fever or chest pain.  Last colonoscopy was this year where she had polyps removed but no significant problems.  The history is provided by the patient.  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     Family History  Problem Relation Age of Onset   Arthritis Mother    Breast cancer Mother    Thyroid cancer Mother    Lung cancer Father    Hypertension Father    Hyperlipidemia Father    Lung cancer Sister    Breast cancer Sister    Heart attack Sister    Hypertension Brother    Hyperlipidemia Brother     Social History   Tobacco Use   Smoking status: Former    Pack years: 0.00    Types: Cigarettes    Quit date: 1965    Years since quitting: 57.5   Smokeless tobacco: Never  Vaping Use   Vaping Use: Never used  Substance  Use Topics   Alcohol use: Not Currently    Comment: 20 years without alcohol    Drug use: Never    Home Medications Prior to Admission medications   Medication Sig Start Date End Date Taking? Authorizing Provider  atorvastatin (LIPITOR) 80 MG tablet Take 40 mg by mouth daily. 05/29/19   [provider]  Calcium Carb-Cholecalciferol 600-800 MG-UNIT CHEW Chew 600 mg by mouth daily.    [provider]  Dulaglutide (TRULICITY) 9.52 WU/1.3KG SOPN Inject 0.75 mLs into the skin once a week. 04/23/19   [provider]  enalapril (VASOTEC) 2.5 MG tablet Take 2.5 mg by mouth daily.    [provider]  furosemide (LASIX) 20 MG tablet TAKE 1 TABLET (20 MG TOTAL) BY MOUTH IN THE MORNING. 07/28/20 10/26/20  Tolia, Sunit, DO  HYDROcodone-acetaminophen (NORCO) 5-325 MG tablet Take 1 tablet by mouth 3 (three) times daily as needed. 07/15/20    Aundra Dubin, PA-C  Insulin Lispro Prot & Lispro (HUMALOG 75/25 MIX) (75-25) 100 UNIT/ML Kwikpen As directed. 03/29/19   [provider]  levothyroxine (SYNTHROID, LEVOTHROID) 125 MCG tablet Take 125 mcg by mouth daily before breakfast.    [provider]  loratadine (CLARITIN) 10 MG tablet Take 10 mg by mouth as needed.    [provider]  Magnesium 400 MG CAPS Take 1 capsule by mouth daily.    [provider]  metFORMIN (GLUCOPHAGE) 1000 MG tablet Take 1,000 mg by mouth 2 (two) times daily with a meal.    [provider]  metoprolol succinate (TOPROL XL) 25 MG 24 hr tablet Take 1 tablet (25 mg total) by mouth daily. Hold if systolic blood pressure (top blood pressure number) less than 100 mmHg or heart rate less than 60 bpm (pulse). 03/17/20 09/13/20  Tolia, Sunit, DO  Multiple Vitamins-Minerals (MULTIVITAMIN WITH MINERALS) tablet Take 1 tablet by mouth daily.    [provider]  ondansetron (ZOFRAN) 4 MG tablet Take 1 tablet (4 mg total) by mouth every 8 (eight) hours as needed for nausea or vomiting. 07/15/20   Aundra Dubin, PA-C  RYBELSUS 7 MG TABS Take 1 tablet by mouth every morning. 08/12/20   [provider]    Allergies    Epinephrine, Novocain [procaine], and Penicillins  Review of Systems   Review of Systems  Gastrointestinal:  Positive for abdominal pain.  All other systems reviewed and are negative.  Physical Exam Updated Vital Signs BP 129/90 (BP Location: Right Arm)   Pulse 65   Resp 18   Ht 5\' 6"  (1.676 m)   Wt 93.4 kg   SpO2 95%   BMI 33.25 kg/m   Physical Exam Vitals and nursing note reviewed.  Constitutional:      General: She is not in acute distress.    Appearance: She is well-developed.  HENT:     Head: Normocephalic and atraumatic.  Eyes:     Pupils: Pupils are equal, round, and reactive to light.  Cardiovascular:     Rate and Rhythm: Normal rate and regular rhythm.     Heart sounds:  Normal heart sounds. No murmur heard.   No friction rub.  Pulmonary:     Effort: Pulmonary effort is normal.     Breath sounds: Normal breath sounds. No wheezing or rales.  Abdominal:     General: Abdomen is flat. Bowel sounds are normal. There is abdominal bruit. There is no distension.     Palpations: Abdomen is soft.  Tenderness: There is abdominal tenderness in the left upper quadrant and left lower quadrant. There is guarding. There is no right CVA tenderness, left CVA tenderness or rebound.     Hernia: No hernia is present.  Musculoskeletal:        General: No tenderness. Normal range of motion.     Right lower leg: Edema present.     Left lower leg: Edema present.     Comments: Edema noted bilaterally but no erythema or warmth.  No calf tenderness.  DP pulses present bilaterally.  Mild tenderness with palpation in the inguinal line but no rash or palpable lymph nodes.  Pain reproduced with hip flexion passive and active.  Mild pain with axial loading on the left outstretched leg  Skin:    General: Skin is warm and dry.     Findings: Rash present.  Neurological:     Mental Status: She is alert and oriented to person, place, and time. Mental status is at baseline.     Cranial Nerves: No cranial nerve deficit.     Sensory: No sensory deficit.     Motor: No weakness.  Psychiatric:        Mood and Affect: Mood normal.        Behavior: Behavior normal.    ED Results / Procedures / Treatments   Labs (all labs ordered are listed, but only abnormal results are displayed) Labs Reviewed  URINALYSIS, ROUTINE W REFLEX MICROSCOPIC - Abnormal; Notable for the following components:      Result Value   Leukocytes,Ua TRACE (*)    All other components within normal limits  COMPREHENSIVE METABOLIC PANEL - Abnormal; Notable for the following components:   Glucose, Bld 191 (*)    All other components within normal limits  URINALYSIS, MICROSCOPIC (REFLEX) - Abnormal; Notable for the  following components:   Bacteria, UA FEW (*)    All other components within normal limits  CBC WITH DIFFERENTIAL/PLATELET  LIPASE, BLOOD    EKG None  Radiology CT ABDOMEN PELVIS W CONTRAST  Result Date: 08/24/2020 CLINICAL DATA:  Left lower quadrant pain radiating to the leg. EXAM: CT ABDOMEN AND PELVIS WITH CONTRAST TECHNIQUE: Multidetector CT imaging of the abdomen and pelvis was performed using the standard protocol following bolus administration of intravenous contrast. CONTRAST:  161mL OMNIPAQUE IOHEXOL 300 MG/ML  SOLN COMPARISON:  None. FINDINGS: Lower chest: Normal heart size. Dependent atelectasis within the bilateral lower lobes. Calcified granuloma within the lingula. No pleural effusion. Hepatobiliary: Liver is normal in size and contour. Prior cholecystectomy. No intrahepatic or extrahepatic biliary ductal dilatation. Pancreas: Suggestion of a 9 mm cystic lesion within the pancreatic tail (image 47; series 5). Spleen: Multiple calcified granulomas. Adrenals/Urinary Tract: Normal adrenal glands. Kidneys enhance symmetrically with contrast. There is a 2.1 cm partially exophytic lesion off of the superior pole of the left kidney (image 22; series 2) with an internal density of 30 Hounsfield units, indeterminate. Urinary bladder is unremarkable. Stomach/Bowel: No abnormal bowel wall thickening or evidence for bowel obstruction. No free fluid or free intraperitoneal air. Morphology of the stomach. Vascular/Lymphatic: Normal caliber abdominal aorta. Peripheral calcified atherosclerotic plaque. No retroperitoneal lymphadenopathy. Reproductive: Uterus and adnexal structures are unremarkable. Other: None. Musculoskeletal: Lower thoracic and lumbar spine degenerative changes. Retrolisthesis of the L3 on L4 vertebral body with narrowing of the canal. Degenerative changes at this level. No aggressive or acute appearing osseous lesions. IMPRESSION: There is an indeterminate exophytic lesion off of the  superior pole of the left kidney.  Possibility of enhancing renal lesion not excluded. This needs dedicated evaluation with pre and post contrast-enhanced MRI in the non acute setting. Suggestion of a 9 mm cystic lesion within the pancreatic tail. This can be further evaluated with pre and post contrast-enhanced MRI as recommended for the evaluation of the renal lesion as well. Retrolisthesis of L3 on L4 with narrowing of the canal at this level. No acute process within the abdomen or pelvis. Electronically Signed   By: Lovey Newcomer M.D.   On: 08/24/2020 10:25    Procedures Procedures   Medications Ordered in ED Medications  ondansetron (ZOFRAN) injection 4 mg (has no administration in time range)  fentaNYL (SUBLIMAZE) injection 25 mcg (has no administration in time range)    ED Course  I have reviewed the triage vital signs and the nursing notes.  Pertinent labs & imaging results that were available during my care of the patient were reviewed by me and considered in my medical decision making (see chart for details).    MDM Rules/Calculators/A&P                          Elderly female presenting today with pain in the left lower quadrant and left upper leg that has been worsening over the last 2 days.  Pain is reproduced by standing, flexing the hip and palpating the abdomen.  She is not having any fever and does not have any evidence of arterial issues as she has normal pulse in her foot, normal capillary refill.  Patient does have bilateral swelling in her lower extremities but this is a more chronic problem and lower suspicion for DVT.  Given patient's abdominal exam concern for possible diverticulitis, hernia, urinary source.  Also potential muscular cause for her symptoms.  Lower suspicion for radiculopathy.  No rashes or evidence to suggest shingles.  Patient reports colonoscopy earlier this year with polyps removed but unclear if patient had diverticulosis as those records are not present  in our system.  Given pain control.  Labs and imaging pending.  10:52 AM Patient's labs are within normal limits.  CT does show a lesion on the left kidney and pancreas that will need MRI follow-up but not convinced that that is the cause of her pain today.  Suspect the pain is musculoskeletal in nature.  Discussed the findings with the patient.  She would like to try prednisone taper and she also has Voltaren gel and Tylenol.  She will call [place] [facility] who she sees for other reasons to follow-up on the pain in her hip.  She was also call her PCP on Monday to schedule MRI.  MDM   Amount and/or Complexity of Data Reviewed Clinical lab tests: ordered and reviewed Tests in the radiology section of CPT: ordered and reviewed Independent visualization of images, tracings, or specimens: yes     Final Clinical Impression(s) / ED Diagnoses Final diagnoses:  Left hip pain    Rx / DC Orders ED Discharge Orders     None        Blanchie Dessert, MD 08/24/20 1055

## 2020-08-24 NOTE — ED Notes (Signed)
Vicente Serene, RN will attempt IV.

## 2020-08-24 NOTE — ED Notes (Signed)
Unsuccessful IV attempt, RAC. Pt tolerated well.

## 2020-08-24 NOTE — Discharge Instructions (Addendum)
The blood work today was normal.  The CAT scan did not show any signs of aneurysm, bladder problems, ovarian problems, diverticulitis.  It did show that you had a cyst on the left kidney that we will need MRI in the future for evaluation.  You need to call your doctor about that on Monday.  Also you can use Voltaren gel and extra strength Tylenol every 6 hours as needed for the pain.  You can follow-up with [facility] [facility] about this.

## 2020-08-28 ENCOUNTER — Other Ambulatory Visit: Payer: Self-pay | Admitting: Family Medicine

## 2020-08-28 DIAGNOSIS — N289 Disorder of kidney and ureter, unspecified: Secondary | ICD-10-CM

## 2020-08-31 ENCOUNTER — Ambulatory Visit
Admission: RE | Admit: 2020-08-31 | Discharge: 2020-08-31 | Disposition: A | Discharge status: Home / Self Care | Payer: Medicare Other | Source: Ambulatory Visit | Attending: Family Medicine | Admitting: Family Medicine

## 2020-08-31 ENCOUNTER — Other Ambulatory Visit: Payer: Self-pay

## 2020-08-31 DIAGNOSIS — N289 Disorder of kidney and ureter, unspecified: Secondary | ICD-10-CM

## 2020-08-31 IMAGING — MR MR ABDOMEN WO/W CM
17 series · 48 of 48 positions shown · IV contrast (multihance)
Comparison: CT scan [DATE]

CLINICAL DATA: Indeterminate exophytic left renal lesion.

EXAM:
MRI ABDOMEN WITHOUT AND WITH CONTRAST
TECHNIQUE: Multiplanar multisequence MR imaging of the abdomen was performed
both before and after the administration of intravenous contrast.
CONTRAST:  19mL MULTIHANCE GADOBENATE DIMEGLUMINE 529 MG/ML IV SOLN

[Series 4: T2 · axial · 5.5mm · 1.48mm/px · 1 of 38 slices shown (1 of 3)]
[im 1/38]
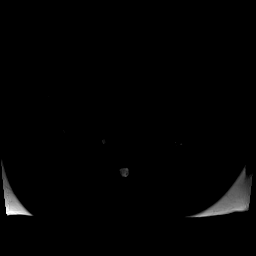

[Series 5: DWI · axial · 5.5mm · 1.42mm/px · z∈[-93,+138]mm · 4 of 108 slices shown (1 of 2)]
[im 1/108]
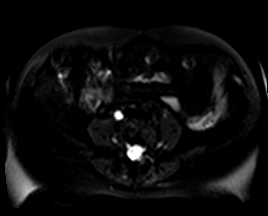
[im 36/108]
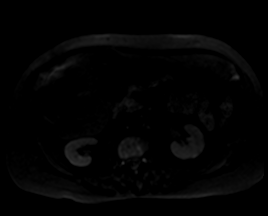
[im 72/108]
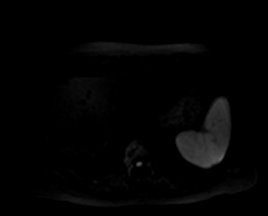
[im 108/108]
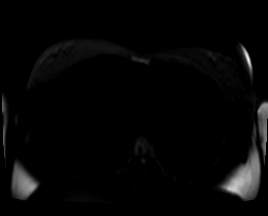

[Series 6: DWI · axial · 5.5mm · 1.42mm/px · z∈[-93,+138]mm · 2 of 36 slices shown (2 of 2)]
[im 1/36]
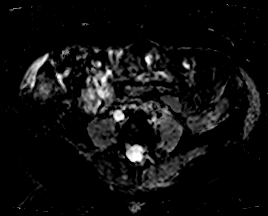
[im 36/36]
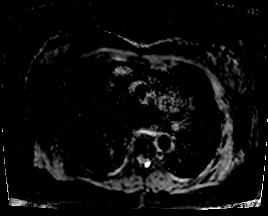

[Series 7: T2 · coronal · 5.0mm · 1.56mm/px · 2 of 36 slices shown (2 of 3)]
[im 1/36]
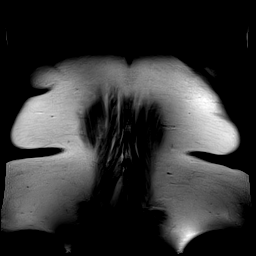
[im 36/36]
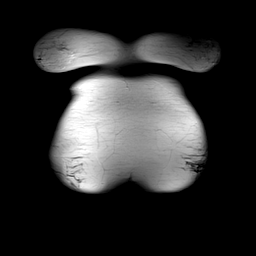

[Series 9: T2 · axial · 6.5mm · 1.22mm/px · 1 of 30 slices shown (3 of 3)]
[im 1/30]
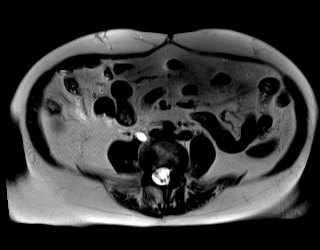

[Series 11: T1 · axial · 3.5mm · 1.19mm/px · z∈[-122,+127]mm · 6 of 144 slices shown]
[im 1/144]
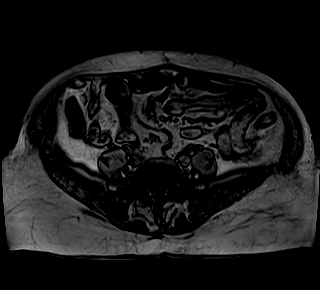
[im 29/144]
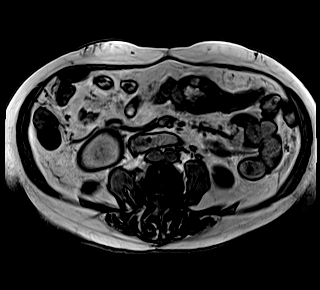
[im 58/144]
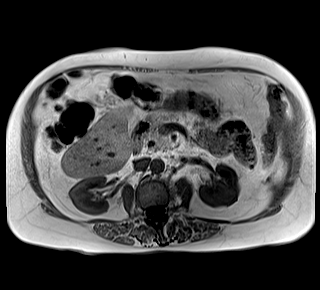
[im 86/144]
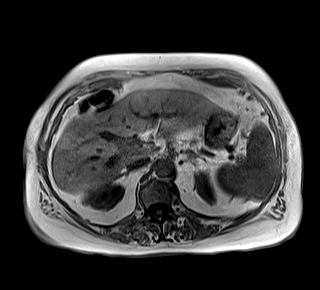
[im 115/144]
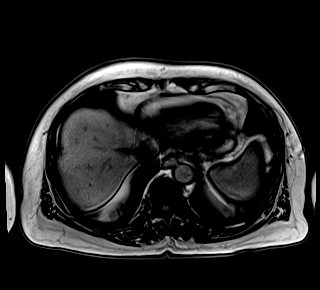
[im 144/144]
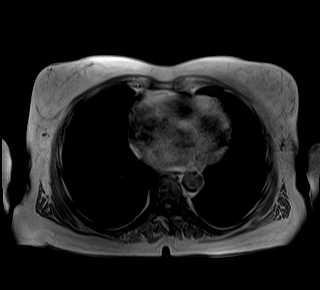

[Series 12: bSSFP · axial · 5.5mm · 1.25mm/px · z∈[-120,+125]mm · 2 of 38 slices shown]
[im 1/38]
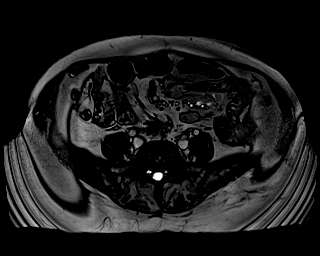
[im 38/38]
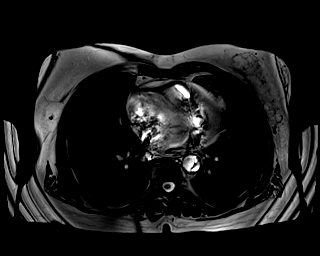

[Series 13: T1 dynamic · axial · non-contrast · 3.5mm · 1.25mm/px · z∈[-122,+127]mm · 3 of 72 slices shown]
[im 1/72]
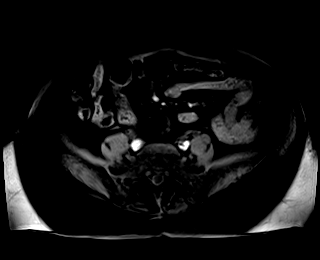
[im 36/72]
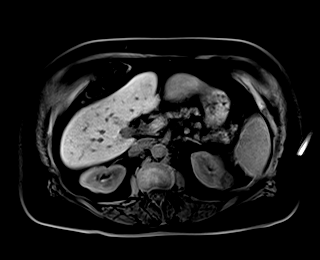
[im 72/72]
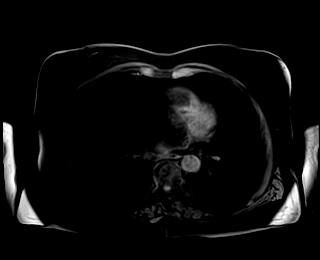

[Series 14: T1 dynamic post-contrast · axial · 3.5mm · 1.25mm/px · z∈[-122,+127]mm · 3 of 72 slices shown (1 of 9)]
[im 1/72]
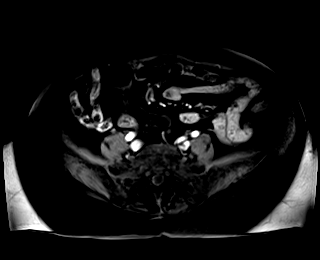
[im 36/72]
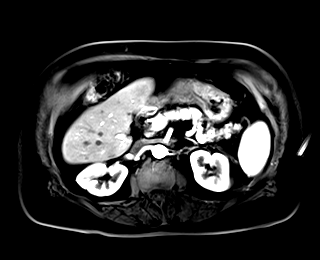
[im 72/72]
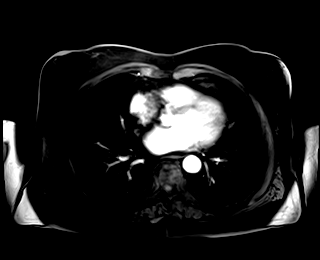

[Series 15: T1 dynamic post-contrast · axial · 3.5mm · 1.25mm/px · z∈[-122,+127]mm · 3 of 72 slices shown (2 of 9)]
[im 1/72]
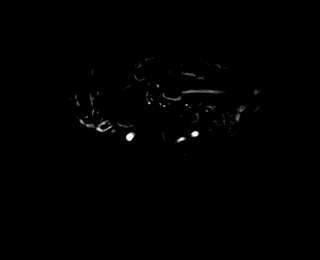
[im 36/72]
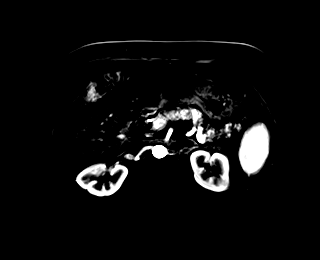
[im 72/72]
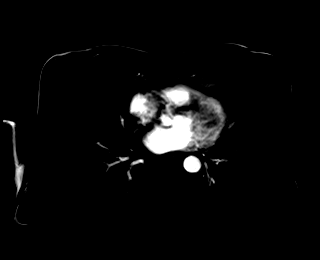

[Series 16: T1 dynamic post-contrast · axial · 3.5mm · 1.25mm/px · z∈[-122,+127]mm · 3 of 72 slices shown (3 of 9)]
[im 1/72]
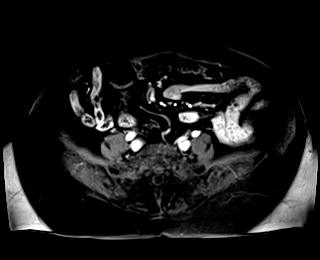
[im 36/72]
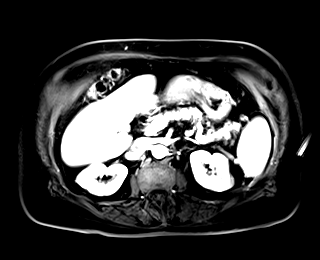
[im 72/72]
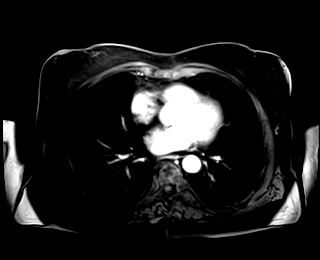

[Series 17: T1 dynamic post-contrast · axial · 3.5mm · 1.25mm/px · z∈[-122,+127]mm · 3 of 72 slices shown (4 of 9)]
[im 1/72]
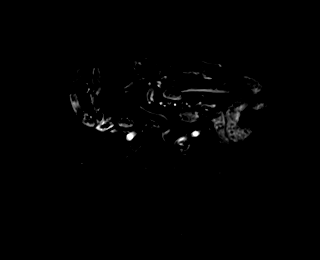
[im 36/72]
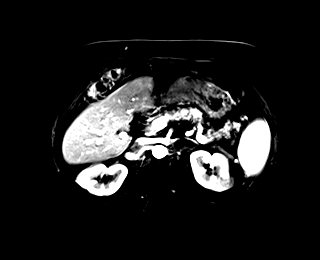
[im 72/72]
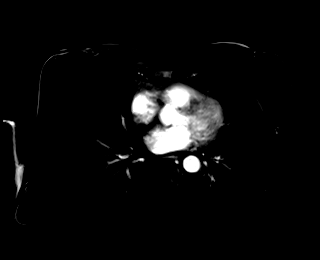

[Series 18: T1 dynamic post-contrast · axial · 3.5mm · 1.25mm/px · z∈[-122,+127]mm · 3 of 72 slices shown (5 of 9)]
[im 1/72]
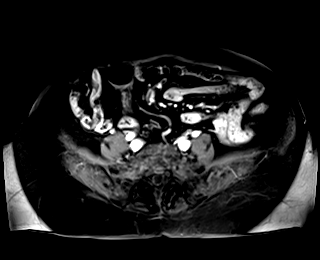
[im 36/72]
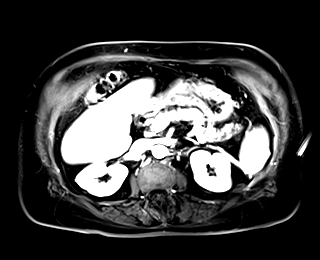
[im 72/72]
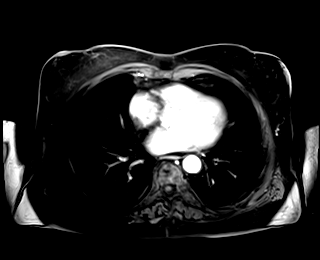

[Series 19: T1 dynamic post-contrast · axial · 3.5mm · 1.25mm/px · z∈[-122,+127]mm · 3 of 72 slices shown (6 of 9)]
[im 1/72]
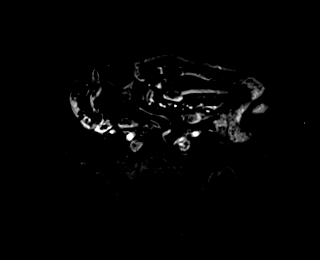
[im 36/72]
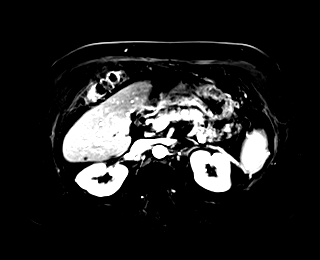
[im 72/72]
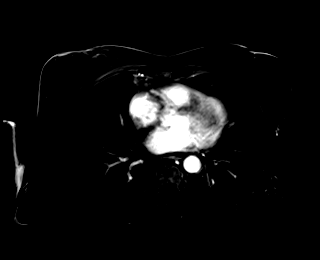

[Series 20: T1 dynamic post-contrast · coronal · 3.0mm · 1.25mm/px · 3 of 72 slices shown (7 of 9)]
[im 1/72]
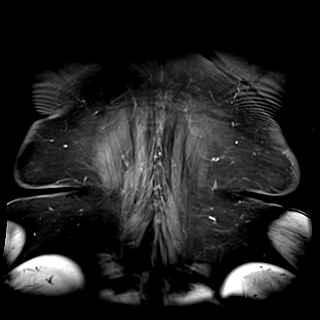
[im 36/72]
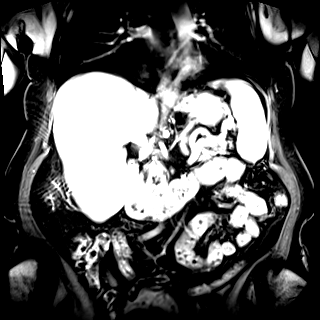
[im 72/72]
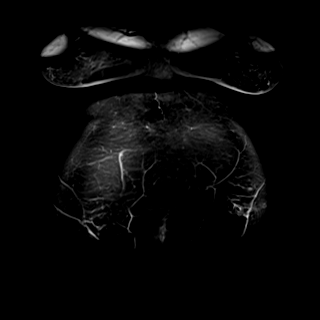

[Series 21: T1 dynamic post-contrast · axial · 3.5mm · 1.25mm/px · z∈[-122,+127]mm · 3 of 72 slices shown (8 of 9)]
[im 1/72]
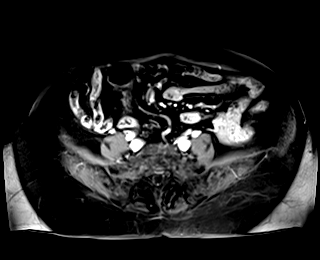
[im 36/72]
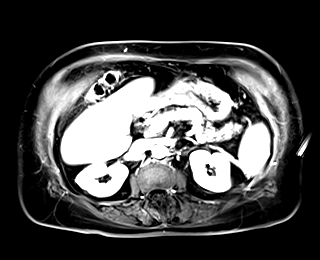
[im 72/72]
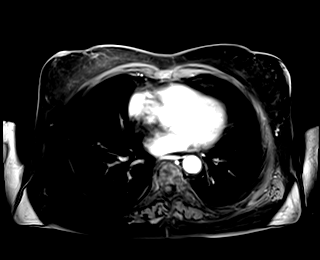

[Series 22: T1 dynamic post-contrast · axial · 3.5mm · 1.25mm/px · z∈[-122,+127]mm · 3 of 72 slices shown (9 of 9)]
[im 1/72]
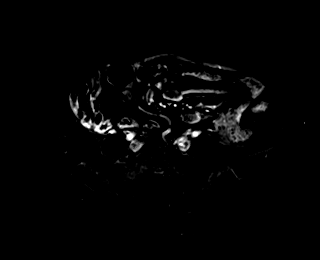
[im 36/72]
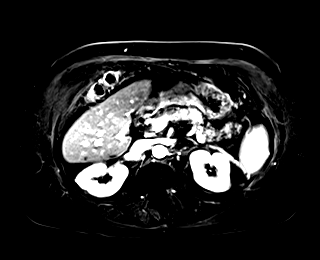
[im 72/72]
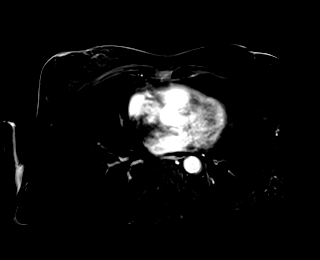

[48 of 48 positions shown; findings below may reference images not displayed]

FINDINGS: Lower chest: Unremarkable.

Hepatobiliary: Tiny subcapsular cyst noted posterior right liver. No
suspicious enhancing lesion identified in the liver parenchyma.
Gallbladder is surgically absent. No intrahepatic or extrahepatic
biliary dilation.

Pancreas: Pancreas divisum anatomy. 7 mm bilobed cystic lesion in
the tail of pancreas evident without main duct dilatation. No
suspicious enhancement although tiny lesion size hinders assessment.

Spleen:  5 mm nonenhancing focus may be a tiny cyst or pseudocyst.

Adrenals/Urinary Tract: No adrenal nodule or mass. 2.2 x 1.9 cm
exophytic lesion lateral upper pole left kidney is relatively
well-defined with heterogeneous central enhancement after IV
contrast administration, clearly demonstrated on 45 second
postcontrast subtraction image 35 of series 17. Lesion extends into
the adjacent perinephric fat but does not extend deep into the
central sinus fat. No filling defect in the left renal vein. There
appears to be a tiny accessory left renal artery arising from the
aorta just anterior and cranial to the dominant left renal artery
(axial arterial phase postcontrast image 38 of series [DATE] mm
subcapsular simple cyst noted posterior interpolar left kidney.

Right kidney unremarkable.

Stomach/Bowel: Stomach is unremarkable. No gastric wall thickening.
No evidence of outlet obstruction. Duodenum is normally positioned
as is the ligament of Treitz. No small bowel wall thickening. No
small bowel dilatation.

Vascular/Lymphatic: No abdominal aortic aneurysm. No abdominal
lymphadenopathy 1.4 x 1.0 cm cystic nonenhancing lesion is
identified in the right retroperitoneal space, wedged between the
right psoas muscle and the right common iliac vein (well seen axial
T2 image 37/4).

Other:  No intraperitoneal free fluid.

Musculoskeletal: No focal suspicious marrow enhancement within the
visualized bony anatomy.
IMPRESSION: 1. 2.2 x 1.9 cm exophytic lesion in the lateral upper pole left
kidney shows heterogeneous central enhancement after IV contrast
administration. Imaging features are consistent with renal cell
carcinoma.
2. No evidence for filling defect in the left renal vein. Probable
tiny accessory left renal artery. No findings to suggest metastatic
disease in the abdomen.
[DATE] x 1.0 cm cystic lesion in the right retroperitoneal space,
wedged between the right psoas muscle and the right common iliac
vein. Imaging features are nonspecific but may be related to a
lymphangioma or other benign process. In retrospect, this was
visible on previous CT. Attention on surveillance imaging for the
left renal lesion recommended.
4. 7 mm bilobed cystic lesion in the tail of pancreas without
suspicious enhancement. Imaging features may reflect side branch
IPMN. Follow-up MRI in 2 years recommended to ensure stability. This
recommendation follows ACR consensus guidelines: Management of
Incidental Pancreatic Cysts: A White Paper of the ACR Incidental
Findings Committee. [HOSPITAL] [4I];[DATE].

## 2020-08-31 MED ORDER — GADOBENATE DIMEGLUMINE 529 MG/ML IV SOLN
19.0000 mL | Freq: Once | INTRAVENOUS | Status: AC | PRN
  Administered 2020-08-31: 19 mL via INTRAVENOUS

## 2020-09-12 ENCOUNTER — Encounter: Payer: Self-pay | Admitting: Orthopaedic Surgery

## 2020-09-12 ENCOUNTER — Ambulatory Visit (INDEPENDENT_AMBULATORY_CARE_PROVIDER_SITE_OTHER): Payer: Medicare Other | Admitting: Physician Assistant

## 2020-09-12 DIAGNOSIS — Z9889 Other specified postprocedural states: Secondary | ICD-10-CM

## 2020-09-12 DIAGNOSIS — G5601 Carpal tunnel syndrome, right upper limb: Secondary | ICD-10-CM

## 2020-09-12 NOTE — Progress Notes (Signed)
   Post-Op Visit Note   Patient: Laura Holland           Date of Birth: 09-Jun-1945           MRN: [id number] Visit Date: 09/12/2020 PCP: Carol Ada, MD   Assessment & Plan:  Chief Complaint:  Chief Complaint  Patient presents with   Right Hand - Pain   Visit Diagnoses:  1. Carpal tunnel syndrome, right upper limb   2. S/P carpal tunnel release     Plan: Patient is a pleasant 75 year old female who comes in today 8 weeks out right carpal tunnel release 07/17/2020.  She has been doing well.  She still notes some hypersensitivity around the incision as well as slight decreased grip strength but nothing more.  She denies any paresthesias throughout the median nerve distribution.  Examination of her right hand reveals a fully healed surgical scar without complication.  Fingers are warm well perfused.  She is neurovascular intact distally.  She does have slight limitation with making a full composite fist.  At this point, I recommended desensitization with lotion or cream.  Have also recommended that she pick up a stress ball or putty to continue working on range of motion exercises.  She will follow-up with [place] as needed.  Call with concerns or questions.  Follow-Up Instructions: Return if symptoms worsen or fail to improve.   Orders:  No orders of the defined types were placed in this encounter.  No orders of the defined types were placed in this encounter.   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   Past Medical History:  Diagnosis Date   Diabetes mellitus without complication ([place])    High cholesterol    Hypertension    Hypothyroid     Family History  Problem Relation Age of Onset   Arthritis Mother    Breast cancer Mother    Thyroid cancer Mother    Lung cancer Father    Hypertension Father     Hyperlipidemia Father    Lung cancer Sister    Breast cancer Sister    Heart attack Sister    Hypertension Brother    Hyperlipidemia Brother     Past Surgical History:  Procedure Laterality Date   APPENDECTOMY     CATARACT EXTRACTION, BILATERAL  2020   CESAREAN SECTION     CHOLECYSTECTOMY     dental implants  08/2019   LAMINECTOMY     Social History   Occupational History   Not on file  Tobacco Use   Smoking status: Former    Pack years: 0.00    Types: Cigarettes    Quit date: 1965    Years since quitting: 57.5   Smokeless tobacco: Never  Vaping Use   Vaping Use: Never used  Substance and Sexual Activity   Alcohol use: Not Currently    Comment: 20 years without alcohol    Drug use: Never   Sexual activity: Not on file

## 2020-09-23 ENCOUNTER — Other Ambulatory Visit: Payer: Self-pay | Admitting: Urology

## 2020-09-23 DIAGNOSIS — N2889 Other specified disorders of kidney and ureter: Secondary | ICD-10-CM

## 2020-10-07 ENCOUNTER — Ambulatory Visit
Admission: RE | Admit: 2020-10-07 | Discharge: 2020-10-07 | Disposition: A | Discharge status: Home / Self Care | Payer: Medicare Other | Source: Ambulatory Visit | Attending: Urology | Admitting: Urology

## 2020-10-07 ENCOUNTER — Encounter: Payer: Self-pay | Admitting: *Deleted

## 2020-10-07 DIAGNOSIS — N2889 Other specified disorders of kidney and ureter: Secondary | ICD-10-CM

## 2020-10-07 HISTORY — PX: IR RADIOLOGIST EVAL & MGMT: IMG5224

## 2020-10-07 NOTE — Consult Note (Signed)
Chief Complaint: Left Renal Mass  Referring Physician(s): Pace,Maryellen D  History of Present Illness: Laura Holland is a 75 y.o. female presenting as a scheduled consultation to [facility] today, kindly referred by Dr. Claudia Desanctis of [facility], to discuss treatment options of left renal mass.   Laura Holland is here today with her Holland in the clinic to discuss.   She tells me that this left renal mass was discovered incidentally on a CT performed for some vague abdominal and back pain.  The date of the study is 08/24/20.  The size estimated of the left renal lesion on the CT is 2.1cm.  She then had MRI performed 09/01/20, and this confirms a suspicious tumor, 2.2cm.  She has not had a biopsy.   She denies any gross hematuria. She has not had flank pain that we would attribute to the lesion.    The lesion is exophytic from the left kidney ~2.2cm.  There is no direct involvement of the collecting system.    She denies any knowledge of MI, stroke or CHF.  She does have cardiology, but for arrhythmia.  She is not on blood thinner.  She denies resting chest pain or pain with activity.    Her average day is spent doing usual activities, and she is not restricted in any of her ADL's.   Past Medical History:  Diagnosis Date   Diabetes mellitus without complication (HCC)    High cholesterol    Hypertension    Hypothyroid     Past Surgical History:  Procedure Laterality Date   APPENDECTOMY     CATARACT EXTRACTION, BILATERAL  2020   CESAREAN SECTION     CHOLECYSTECTOMY     dental implants  08/2019   LAMINECTOMY      Allergies: Epinephrine, Novocain [procaine], and Penicillins  Medications: Prior to Admission medications   Medication Sig Start Date End Date Taking? Authorizing Provider  atorvastatin (LIPITOR) 80 MG tablet Take 40 mg by mouth daily. 05/29/19   [provider]  Calcium Carb-Cholecalciferol 600-800 MG-UNIT CHEW Chew 600 mg by mouth daily.    [provider]  Dulaglutide (TRULICITY) A999333 [id number] SOPN Inject 0.75 mLs into the skin once a week. 04/23/19   [provider]  enalapril (VASOTEC) 2.5 MG tablet Take 2.5 mg by mouth daily.    [provider]  furosemide (LASIX) 20 MG tablet TAKE 1 TABLET (20 MG TOTAL) BY MOUTH IN THE MORNING. 07/28/20 10/26/20  Tolia, Sunit, DO  HYDROcodone-acetaminophen (NORCO) 5-325 MG tablet Take 1 tablet by mouth 3 (three) times daily as needed. 07/15/20   Aundra Dubin, PA-C  Insulin Lispro Prot & Lispro (HUMALOG 75/25 MIX) (75-25) 100 UNIT/ML Kwikpen As directed. 03/29/19   [provider]  levothyroxine (SYNTHROID, LEVOTHROID) 125 MCG tablet Take 125 mcg by mouth daily before breakfast.    [provider]  loratadine (CLARITIN) 10 MG tablet Take 10 mg by mouth as needed.    [provider]  Magnesium 400 MG CAPS Take 1 capsule by mouth daily.    [provider]  metFORMIN (GLUCOPHAGE) 1000 MG tablet Take 1,000 mg by mouth 2 (two) times daily with a meal.    [provider]  metoprolol succinate (TOPROL XL) 25 MG 24 hr tablet Take 1 tablet (25 mg total) by mouth daily. Hold if systolic blood pressure (top blood pressure number) less than 100 mmHg or heart rate less than 60 bpm (pulse). 03/17/20 09/13/20  Terri Skains,  Sunit, DO  Multiple Vitamins-Minerals (MULTIVITAMIN WITH MINERALS) tablet Take 1 tablet by mouth daily.    [provider]  ondansetron (ZOFRAN) 4 MG tablet Take 1 tablet (4 mg total) by mouth every 8 (eight) hours as needed for nausea or vomiting. 07/15/20   Aundra Dubin, PA-C  predniSONE (DELTASONE) 20 MG tablet Take 2 tablets (40 mg total) by mouth daily. 08/24/20   Blanchie Dessert, MD  RYBELSUS 7 MG TABS Take 1 tablet by mouth every morning. 08/12/20   [provider]     Family History  Problem Relation Age of Onset   Arthritis Mother    Breast cancer Mother    Thyroid cancer Mother    Lung cancer Father     Hypertension Father    Hyperlipidemia Father    Lung cancer Sister    Breast cancer Sister    Heart attack Sister    Hypertension Brother    Hyperlipidemia Brother     Social History   Socioeconomic History   Marital status: Married    Spouse name: Not on file   Number of children: 1   Years of education: Not on file   Highest education level: Not on file  Occupational History   Not on file  Tobacco Use   Smoking status: Former    Types: Cigarettes    Quit date: 1965    Years since quitting: 17.6   Smokeless tobacco: Never  Vaping Use   Vaping Use: Never used  Substance and Sexual Activity   Alcohol use: Not Currently    Comment: 20 years without alcohol    Drug use: Never   Sexual activity: Not on file  Other Topics Concern   Not on file  Social History Narrative   Not on file   Social Determinants of Health   Financial Resource Strain: Not on file  Food Insecurity: Not on file  Transportation Needs: Not on file  Physical Activity: Not on file  Stress: Not on file  Social Connections: Not on file    ECOG Status: 0 - Asymptomatic  Review of Systems: A 12 point ROS discussed and pertinent positives are indicated in the HPI above.  All other systems are negative.  Review of Systems  Vital Signs: BP (!) 127/58 (BP Location: Left Arm)   Pulse 62   SpO2 94%   Physical Exam General: 75 yo female appearing stated age.  Well-developed, well-nourished.  No distress. HEENT: Atraumatic, normocephalic.  Glasses. Conjugate gaze, extra-ocular motor intact. No scleral icterus or scleral injection. No lesions on external ears, nose, lips, or gums.  Oral mucosa moist, pink.  Neck: Symmetric with no goiter enlargement.  Chest/Lungs:  Symmetric chest with inspiration/expiration.  No labored breathing.  Clear to auscultation with no wheezes, rhonchi, or rales.  Heart:  RRR, with no third heart sounds appreciated. No JVD appreciated.  Abdomen:  Soft, NT/ND, with + bowel  sounds.   Genito-urinary: Deferred Neurologic: Alert & Oriented to person, place, and time.   Normal affect and insight.  Appropriate questions.  Moving all 4 extremities with gross sensory intact.     Imaging: No results found.  Labs:  CBC: Recent Labs    08/24/20 0747  WBC 5.7  HGB 12.3  HCT 36.1  PLT 189    COAGS: No results for input(s): INR, APTT in the last 8760 hours.  BMP: Recent Labs    08/24/20 0816  NA 139  K 3.5  CL 102  CO2 30  GLUCOSE 191*  BUN 15  CALCIUM 9.1  CREATININE 0.83  GFRNONAA >60    LIVER FUNCTION TESTS: Recent Labs    08/24/20 0816  BILITOT 1.0  AST 16  ALT 15  ALKPHOS 80  PROT 6.7  ALBUMIN 3.8    TUMOR MARKERS: No results for input(s): AFPTM, CEA, CA199, CHROMGRNA in the last 8760 hours.  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   If this is indeed an RCC, this would be stage 1a.   I had a lengthy discussion with Laura Holland today regarding the anatomy of kidney, pathophysiology of the kidney/RCC, and the treatment options for RCC, including active surveillance, nephron sparing surgery, and image guided ablation.    We spent the majority of this discussion talking about the logistics and expectations of image guided ablation, whether with cryoablation or microwave ablation.  I did let them know this is performed as out patient basis, often with 23 hr observation, with general anesthesia.  The risks of such a procedure include bleeding, infection, kidney injury with renal dysfunction, injury to local structures, recurrence, need for further procedure/surgery, hospitalization, cardiopulmonary collapse, death.   I did let them know that with this size, configuration, and location of the lesion, we would anticipate a favorable treatment with ablation.  We would think of this treatment as curative for RCC of 2.2cm.  I did let them know that we would perform  surveillance after such an ablation to monitor for any local recurrence, and that this would be at least semi-annual imaging for the first year or two, moving to annual imaging in the future.   She has a scheduled appointment with Dr. Alinda Money to discuss surgical options, which I encouraged her to observe, such that she can make the best informed decision for herself.    I answered all of their questions as best as possible.   Plan: - I encouraged her to observe her upcoming appointment with Dr. Alinda Money.  If she would like formal surgery as option and image guided biopsy is required beforehand, we are happy to perform biopsy.  - Should she wish to be treated with image guided ablation, we are happy to treat her.  This would be at [place] with Dr. Earleen Newport.  General Anesthesia.    Thank you for this interesting consult.  I greatly enjoyed meeting Laura Holland and look forward to participating in their care.  A copy of this report was sent to the requesting provider on this date.  Electronically Signed: Corrie Mckusick 10/07/2020, 2:54 PM   I spent a total of  60 Minutes   in face to face in clinical consultation, greater than 50% of which was counseling/coordinating care for left renal tumor, possible RCC, possible image guided ablation.

## 2020-11-26 ENCOUNTER — Other Ambulatory Visit: Payer: Self-pay | Admitting: Urology

## 2020-12-23 NOTE — Progress Notes (Signed)
DUE TO COVID-19 ONLY ONE VISITOR IS ALLOWED TO COME WITH YOU AND STAY IN THE WAITING ROOM ONLY DURING PRE OP AND PROCEDURE DAY OF SURGERY. THE 1 VISITOR  MAY VISIT WITH YOU AFTER SURGERY IN YOUR PRIVATE ROOM DURING VISITING HOURS ONLY!  YOU NEED TO HAVE A COVID 19 TEST ON__10/31/2022 _____ @_______ , THIS TEST MUST BE DONE BEFORE SURGERY,  COVID TESTING SITE IS AT [place]. PLEASE REMAIN IN YOUR CAR THIS IS A DRIVER UP TEST. AFTER YOUR COVID TEST PLEASE WEAR A MASK OUT IN PUBLIC AND SOCIAL DISTANCE AND [place] YOUR HANDS FREQUENTLY. PLEASE ASK ALL YOUR CLOSE CONTACTS TO WEAR A MASK OUT IN PUBLIC AND SOCIAL DISTANCE AND [place] HANDS FREQUENTLY ALSO.               Laura Holland  12/23/2020   Your procedure is scheduled on:        01/08/2021   Report to [facility] Main  Entrance   Report to admitting at    0900AM     Call this number if you have problems the morning of surgery [phone]    Remember: Do not eat food , candy gum or mints :After Midnight. You may have clear liquids from midnight until  0815 am __ Clear liquid diet the day before surgery.   Miralax 17 g in 4 ounces of water at 12 noon day before surgery.    CLEAR LIQUID DIET   Foods Allowed                                                                       Coffee and tea, regular and decaf                              Plain Jell-O any favor except red or purple                                            Fruit ices (not with fruit pulp)                                      Iced Popsicles                                     Carbonated beverages, regular and diet                                    Cranberry, grape and apple juices Sports drinks like Gatorade Lightly seasoned clear broth or consume(fat free) Sugar   _____________________________________________________________________    BRUSH YOUR TEETH MORNING OF SURGERY AND RINSE YOUR MOUTH OUT, NO CHEWING GUM CANDY OR  MINTS.     Take these medicines the morning of surgery with A SIP OF WATER:  synthroid, toprol ( if due to take)  DO NOT TAKE ANY DIABETIC MEDICATIONS DAY OF YOUR SURGERY                               You may not have any metal on your body including hair pins and              piercings  Do not wear jewelry, make-up, lotions, powders or perfumes, deodorant             Do not wear nail polish on your fingernails.  Do not shave  48 hours prior to surgery.              Men may shave face and neck.   Do not bring valuables to the hospital. [place].  Contacts, dentures or bridgework may not be worn into surgery.  Leave suitcase in the car. After surgery it may be brought to your room.     Patients discharged the day of surgery will not be allowed to drive home. IF YOU ARE HAVING SURGERY AND GOING HOME THE SAME DAY, YOU MUST HAVE AN ADULT TO DRIVE YOU HOME AND BE WITH YOU FOR 24 HOURS. YOU MAY GO HOME BY TAXI OR UBER OR ORTHERWISE, BUT AN ADULT MUST ACCOMPANY YOU HOME AND STAY WITH YOU FOR 24 HOURS.  Name and phone number of your driver:  Special Instructions: N/A              Please read over the following fact sheets you were given: _____________________________________________________________________  [facility] - Preparing for Surgery Before surgery, you can play an important role.  Because skin is not sterile, your skin needs to be as free of germs as possible.  You can reduce the number of germs on your skin by washing with CHG (chlorahexidine gluconate) soap before surgery.  CHG is an antiseptic cleaner which kills germs and bonds with the skin to continue killing germs even after washing. Please DO NOT use if you have an allergy to CHG or antibacterial soaps.  If your skin becomes reddened/irritated stop using the CHG and inform your nurse when you arrive at Short Stay. Do not shave (including legs and underarms) for at least 48 hours  prior to the first CHG shower.  You may shave your face/neck. Please follow these instructions carefully:  1.  Shower with CHG Soap the night before surgery and the  morning of Surgery.  2.  If you choose to wash your hair, wash your hair first as usual with your  normal  shampoo.  3.  After you shampoo, rinse your hair and body thoroughly to remove the  shampoo.                           4.  Use CHG as you would any other liquid soap.  You can apply chg directly  to the skin and wash                       Gently with a scrungie or clean washcloth.  5.  Apply the CHG Soap to your body ONLY FROM THE NECK DOWN.   Do not use on face/ open  Wound or open sores. Avoid contact with eyes, ears mouth and genitals (private parts).                       Wash face,  Genitals (private parts) with your normal soap.             6.  Wash thoroughly, paying special attention to the area where your surgery  will be performed.  7.  Thoroughly rinse your body with warm water from the neck down.  8.  DO NOT shower/wash with your normal soap after using and rinsing off  the CHG Soap.                9.  Pat yourself dry with a clean towel.            10.  Wear clean pajamas.            11.  Place clean sheets on your bed the night of your first shower and do not  sleep with pets. Day of Surgery : Do not apply any lotions/deodorants the morning of surgery.  Please wear clean clothes to the hospital/surgery center.  FAILURE TO FOLLOW THESE INSTRUCTIONS MAY RESULT IN THE CANCELLATION OF YOUR SURGERY PATIENT SIGNATURE_________________________________  NURSE SIGNATURE__________________________________  ________________________________________________________________________

## 2020-12-23 NOTE — Progress Notes (Addendum)
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   Pt was instructed at preop to contact DR Suzette Battiest( endocrinologist) in regards to surgery and diabetic meds and humalog 75/25 dosing prior to surgery.  Pt voiced understanding.  Informed pt to make sure MD is aware on a clear liquid diet day prior to surgery.  Per hospital diabetic protocol pt aware to take 70% of Humalog 75/25 pm before surgery which is 10 units and no diabetic meds days of surgery.   Confirmed with Coni mabe at [facility] that pt is due to be on a clear liquid diet the daybefore surgery.  And the Miralax prep.

## 2020-12-26 ENCOUNTER — Encounter (HOSPITAL_COMMUNITY): Payer: Self-pay

## 2020-12-26 ENCOUNTER — Other Ambulatory Visit: Payer: Self-pay

## 2020-12-26 ENCOUNTER — Encounter (HOSPITAL_COMMUNITY)
Admission: RE | Admit: 2020-12-26 | Discharge: 2020-12-26 | Disposition: A | Discharge status: Home / Self Care | Payer: Medicare Other | Source: Ambulatory Visit | Attending: Urology | Admitting: Urology

## 2020-12-26 VITALS — BP 127/68 | HR 64 | Temp 98.3°F | Resp 18 | Ht 65.0 in | Wt 200.4 lb

## 2020-12-26 DIAGNOSIS — Z7901 Long term (current) use of anticoagulants: Secondary | ICD-10-CM | POA: Insufficient documentation

## 2020-12-26 DIAGNOSIS — E119 Type 2 diabetes mellitus without complications: Secondary | ICD-10-CM | POA: Insufficient documentation

## 2020-12-26 DIAGNOSIS — E139 Other specified diabetes mellitus without complications: Secondary | ICD-10-CM

## 2020-12-26 DIAGNOSIS — I10 Essential (primary) hypertension: Secondary | ICD-10-CM | POA: Insufficient documentation

## 2020-12-26 DIAGNOSIS — C642 Malignant neoplasm of left kidney, except renal pelvis: Secondary | ICD-10-CM | POA: Diagnosis not present

## 2020-12-26 DIAGNOSIS — Z01812 Encounter for preprocedural laboratory examination: Secondary | ICD-10-CM | POA: Diagnosis present

## 2020-12-26 DIAGNOSIS — Z794 Long term (current) use of insulin: Secondary | ICD-10-CM | POA: Insufficient documentation

## 2020-12-26 DIAGNOSIS — Z7984 Long term (current) use of oral hypoglycemic drugs: Secondary | ICD-10-CM | POA: Insufficient documentation

## 2020-12-26 DIAGNOSIS — Z79899 Other long term (current) drug therapy: Secondary | ICD-10-CM | POA: Diagnosis not present

## 2020-12-26 HISTORY — DX: Malignant (primary) neoplasm, unspecified: C80.1

## 2020-12-26 HISTORY — DX: Nausea with vomiting, unspecified: R11.2

## 2020-12-26 HISTORY — DX: Unspecified glaucoma: H40.9

## 2020-12-26 HISTORY — DX: Cardiac murmur, unspecified: R01.1

## 2020-12-26 HISTORY — DX: Other specified postprocedural states: Z98.890

## 2020-12-26 HISTORY — DX: Unspecified osteoarthritis, unspecified site: M19.90

## 2020-12-26 LAB — TYPE AND SCREEN
ABO/RH(D): O POS
Antibody Screen: NEGATIVE

## 2020-12-26 LAB — BASIC METABOLIC PANEL
Anion gap: 8 (ref 5–15)
BUN: 16 mg/dL (ref 8–23)
CO2: 31 mmol/L (ref 22–32)
Calcium: 9.2 mg/dL (ref 8.9–10.3)
Chloride: 103 mmol/L (ref 98–111)
Creatinine, Ser: 0.87 mg/dL (ref 0.44–1.00)
GFR, Estimated: >60 mL/min (ref >60)
Glucose, Bld: 146 mg/dL — ABNORMAL HIGH (ref 70–99)
Potassium: 3.9 mmol/L (ref 3.5–5.1)
Sodium: 142 mmol/L (ref 135–145)

## 2020-12-26 LAB — HEMOGLOBIN A1C
Hgb A1c MFr Bld: 8.2 % — ABNORMAL HIGH (ref 4.8–5.6)
Mean Plasma Glucose: 188.64 mg/dL

## 2020-12-26 LAB — CBC
HCT: 37.9 % (ref 36.0–46.0)
Hemoglobin: 12.5 g/dL (ref 12.0–15.0)
MCH: 30.3 pg (ref 26.0–34.0)
MCHC: 33 g/dL (ref 30.0–36.0)
MCV: 92 fL (ref 80.0–100.0)
Platelets: 201 10*3/uL (ref 150–400)
RBC: 4.12 MIL/uL (ref 3.87–5.11)
RDW: 12.9 % (ref 11.5–15.5)
WBC: 5.6 10*3/uL (ref 4.0–10.5)
nRBC: 0 % (ref 0.0–0.2)

## 2020-12-26 LAB — GLUCOSE, CAPILLARY: Glucose-Capillary: 144 mg/dL — ABNORMAL HIGH (ref 70–99)

## 2020-12-26 NOTE — Anesthesia Preprocedure Evaluation (Addendum)
Anesthesia Evaluation  Patient identified by MRN, date of birth, ID band Patient awake    Reviewed: Allergy & Precautions, NPO status , Patient's Chart, lab work & pertinent test results, reviewed documented beta blocker date and time   History of Anesthesia Complications (+) PONV and history of anesthetic complications  Airway Mallampati: II  TM Distance: >3 FB Neck ROM: Full    Dental no notable dental hx. (+) Implants, Dental Advisory Given, Partial Upper   Pulmonary neg pulmonary ROS, former smoker,    Pulmonary exam normal breath sounds clear to auscultation       Cardiovascular hypertension, Pt. on medications and Pt. on home beta blockers + Valvular Problems/Murmurs  Rhythm:Regular Rate:Normal     Neuro/Psych  Neuromuscular disease    GI/Hepatic negative GI ROS, Neg liver ROS,   Endo/Other  diabetesHypothyroidism   Renal/GU negative Renal ROS     Musculoskeletal  (+) Arthritis ,   Abdominal   Peds  Hematology negative hematology ROS (+)   Anesthesia Other Findings   Reproductive/Obstetrics                          Anesthesia Physical Anesthesia Plan  ASA: 2  Anesthesia Plan: General   Post-op Pain Management:    Induction: Intravenous  PONV Risk Score and Plan: 4 or greater and Ondansetron, Dexamethasone, Treatment may vary due to age or medical condition, Midazolam, Diphenhydramine and Propofol infusion  Airway Management Planned: Oral ETT  Additional Equipment:   Intra-op Plan:   Post-operative Plan: Extubation in OR  Informed Consent: I have reviewed the patients History and Physical, chart, labs and discussed the procedure including the risks, benefits and alternatives for the proposed anesthesia with the patient or authorized representative who has indicated his/her understanding and acceptance.     Dental advisory given  Plan Discussed with: CRNA  Anesthesia  Plan Comments: (See APP note by Durel Salts, FNP )      Anesthesia Quick Evaluation

## 2020-12-26 NOTE — Progress Notes (Signed)
Anesthesia Chart Review:   Case: [id number] Date/Time: 01/08/21 1100   Procedure: XI ROBOTIC ASSITED PARTIAL NEPHRECTOMY (Left)   Anesthesia type: General   Pre-op diagnosis: LEFT RENAL NEOPLASM   Location: [facility] ROOM 03 / [facility]   Surgeons: Raynelle Bring, MD       DISCUSSION: Pt is 75 years old with hx HTN, DM, heart murmur  VS: BP 127/68   Pulse 64   Temp 36.8 C (Oral)   Resp 18   Ht 5\' 5"  (1.651 m)   Wt 90.9 kg   SpO2 98%   BMI 33.34 kg/m   PROVIDERS: - PCP is Carol Ada, MD - Cardiologist is Rex Kras, DO. Last office visit 03/17/20.   LABS: Labs reviewed: Acceptable for surgery. (all labs ordered are listed, but only abnormal results are displayed)  Labs Reviewed  BASIC METABOLIC PANEL - Abnormal; Notable for the following components:      Result Value   Glucose, Bld 146 (*)    All other components within normal limits  GLUCOSE, CAPILLARY - Abnormal; Notable for the following components:   Glucose-Capillary 144 (*)    All other components within normal limits  HEMOGLOBIN A1C - Abnormal; Notable for the following components:   Hgb A1c MFr Bld 8.2 (*)    All other components within normal limits  CBC  TYPE AND SCREEN    EKG 03/17/20: Normal sinus rhythm, 63 bpm, left axis deviation, without underlying ischemia or injury pattern   CV: Echo 06/27/19: - Left ventricle cavity is normal in size. Mild concentric hypertrophy of the left ventricle. Normal global wall motion. Normal LV systolic function with EF 65%. Doppler evidence of grade II (pseudonormal) diastolic  dysfunction, elevated LAP. Calculated EF 65%.  - Left atrial cavity is mildly dilated.  - Mild (Grade I) mitral regurgitation. Mildly restricted mitral valve leaflets.  - Mild tricuspid regurgitation. Estimated pulmonary artery systolic pressure is 24 mmHg.  Nuclear stress test 06/27/19:  - Nondiagnostic ECG stress. - Myocardial perfusion is normal. - Overall LV systolic function is normal without  regional wall motion abnormalities. - Stress LV EF: 56%.  - No previous exam available for comparison. Low risk.   Holter monitor 06/19/19:  - Dominant rhythm normal sinus rhythm. - Heart rate 45-112 bpm.  Average heart rate 66 bpm. - No atrial fibrillation/ sinus pause greater than or equal to 2.5 seconds in duration. - Ventricular ectopy was 15 beats, with 1 ventricular pairs and 0 ventricular runs, total ventricular ectopic burden 0.01%. - Supraventricular ectopy was a 71 beats, with 5 supraventricular runs.  The longest and the fastest supraventricular run was 3 beats in duration 115 bpm. Total supraventricular ectopic burden 0.07%. - Number of patient triggered events: 1, which correlated to normal sinus rhythm.  Past Medical History:  Diagnosis Date   Arthritis    Cancer ([place])    Diabetes mellitus without complication (HCC)    Glaucoma    Heart murmur    High cholesterol    Hypertension    Hypothyroid    PONV (postoperative nausea and vomiting)     Past Surgical History:  Procedure Laterality Date   APPENDECTOMY     CATARACT EXTRACTION, BILATERAL  2020   CESAREAN SECTION     CHOLECYSTECTOMY     dental implants  08/2019   EYE SURGERY     related to glaucoma   IR RADIOLOGIST EVAL & MGMT  10/07/2020   LAMINECTOMY     right carpal tunnel  TONSILLECTOMY      MEDICATIONS:  atorvastatin (LIPITOR) 40 MG tablet   Calcium Carb-Cholecalciferol 600-800 MG-UNIT CHEW   enalapril (VASOTEC) 2.5 MG tablet   fluticasone (FLONASE) 50 MCG/ACT nasal spray   furosemide (LASIX) 20 MG tablet   Insulin Lispro Prot & Lispro (HUMALOG 75/25 MIX) (75-25) 100 UNIT/ML Kwikpen   levothyroxine (SYNTHROID) 100 MCG tablet   loratadine (CLARITIN) 10 MG tablet   Magnesium 400 MG CAPS   metFORMIN (GLUCOPHAGE) 1000 MG tablet   metoprolol succinate (TOPROL XL) 25 MG 24 hr tablet   Multiple Vitamins-Minerals (MULTIVITAMIN WITH MINERALS) tablet   ondansetron (ZOFRAN) 4 MG tablet   RYBELSUS 7 MG  TABS   No current facility-administered medications for this encounter.    If no changes, I anticipate pt can proceed with surgery as scheduled.   Willeen Cass, PhD, FNP-BC [facility] Short Stay Surgical Center/Anesthesiology Phone: [phone] 12/26/2020 12:48 PM

## 2021-01-05 ENCOUNTER — Other Ambulatory Visit: Payer: Self-pay | Admitting: Urology

## 2021-01-06 LAB — SARS CORONAVIRUS 2 (TAT 6-24 HRS): SARS Coronavirus 2: NEGATIVE

## 2021-01-07 NOTE — H&P (Signed)
Office Visit Report     12/30/2020   --------------------------------------------------------------------------------   Laura Holland. [place]  MRN: [id number]  DOB: [birth year], 75 year old Female  SSN:    PRIMARY CARE:  Judithann Sauger, MD  REFERRING:  Raynelle Bring, Eduardo Osier  PROVIDER:  Jacalyn Lefevre, M.D.  TREATING:  Mcarthur Rossetti, [place]  LOCATION:  [facility] [phone]     --------------------------------------------------------------------------------   CC/HPI: Pt presents today for pre-operative history and physical exam in anticipation of a left robotic assisted lap partial nephrectomy by Dr. Alinda Money on 01/08/21. She is doing well and is without complaint.   Last week the pt had a pap smear and vaginal U/S in her OB/GYN office to eval a vaginal discharge. U/S showed the endometrium is slightly fluid filled with a small mass measuring 1.1 x 0.5cm. Neither ovary was visualized and no adenexal masses were seen. Her GYN recommends a biopsy in the office which is scheduled for 01/05/21.   Pt denies F/C, HA, CP, SOB, N/V, constipation, back pain, flank pain, hematuria, and dysuria. She has chronic diarrhea due to her medications and chronic lower ext edema.      HX:   CC: Left renal neoplasm  Physician requesting consult: Dr. Jacalyn Lefevre  Primary care physician: Dr. Carol Ada   Laura Holland is a 75 year old female who presented to the ED in June with abdominal pain. A CT scan of the abdomen and pelvis with contrast was performed and demonstrated an incidental 2 cm left upper pole renal tumor and an incidental small subcentimeter lesion of the pancreatic tail. 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 There was a 7 mm bilobed cystic lesion of the pancreatic tail felt to be benign with a recommendation for repeat imaging in  2 years. She has been counseled by Dr. Claudia Desanctis and has also seen Dr. Earleen Newport in interventional Radiology to discuss options of percutaneous ablation. She presents today to discuss surgical options.   Family history of kidney cancer: None.  Family history of ESRD: None.   Imaging:  Side of renal neoplasm: Left  Size of renal neoplasm: 2.2 cm  Location of renal neoplasm: Left upper pole  Exophytic or endophytic: Exophytic  Renal nephrometry score: 6x   Renal artery anatomy: Single renal artery  Renal vein anatomy: Single renal vein   Contralateral renal lesions: None.  Regional lymphadenopathy: None.  Adrenal masses: None.  Renal vein/IVC involvement: No.  Metastatic disease to the abdomen: No.   Chest imaging: PENDING  LFTs: Normal.   Baseline renal function: Cr 0.83 , eGFR > 60 ml/min   PMH: Past medical history is significant for hypertension, diabetes, hypothyroidism, depression, glaucoma, hyperlipidemia, PVCs.  PSH: Open appendectomy, laparoscopic cholecystectomy, C-section.     ALLERGIES: Epinephrine - tachycardia Penicillin - Skin Rash    MEDICATIONS: Metoprolol Succinate 100 mg tablet, extended release 24 hr  Calcium  Claritin  Enalapril Maleate 2.5 mg tablet  Humalog Mix 75-25 100 unit/ml (75-25) vial  Lasix 20 mg tablet  Levothyroxine 112 mcg capsule  Magnesium 400 mg magnesium capsule  Metformin Er Gastric  Multivitamin  Rybelsus 7 mg tablet     GU PSH: Locm 300-399Mg/Ml Iodine,1Ml - 10/31/2020       PSH Notes: right carpel tunnel repair  tonsillectomy     NON-GU PSH: Appendectomy - 1972  Cataract surgery - 09/23/2019 C-Section - 1981 Glaucoma Surgery - 2019 Lumbar Laminectomy - 1981 Remove Gallbladder - 1994     GU PMH: Left uncertain neoplasm of kidney - 10/31/2020, - 10/28/2020, - 09/22/2020      PMH Notes: PVCs   NON-GU PMH: Arrhythmia Arthritis Cardiac murmur, unspecified Depression Diabetes Type 2 Glaucoma Heart disease,  unspecified Hypercholesterolemia Hypertension Hypothyroidism    FAMILY HISTORY: 1 Daughter - Runs in Family Bladder Cancer - Runs in Family Breast Cancer - Runs in Family Kidney Failure - Runs in Family Lung Cancer - Runs in Family   SOCIAL HISTORY: Marital Status: Married Ethnicity: Not Hispanic Or Latino; Race: White Current Smoking Status: Patient does not smoke anymore. Has not smoked since 09/05/1965. Smoked for 1 year.   Tobacco Use Assessment Completed: Used Tobacco in last 30 days? Has never drank.  Does not use drugs. Drinks 2 caffeinated drinks per day. Has had a blood transfusion.     Notes: transfusion with c section    REVIEW OF SYSTEMS:    GU Review Female:   Patient reports get up at night to urinate and leakage of urine. Patient denies frequent urination, hard to postpone urination, burning /pain with urination, stream starts and stops, trouble starting your stream, have to strain to urinate, and being pregnant.  Gastrointestinal (Upper):   Patient denies nausea, vomiting, and indigestion/ heartburn.  Gastrointestinal (Lower):   Patient reports diarrhea. Patient denies constipation.  Constitutional:   Patient denies fever, night sweats, weight loss, and fatigue.  Skin:   Patient denies skin rash/ lesion and itching.  Eyes:   Patient denies blurred vision and double vision.  Ears/ Nose/ Throat:   Patient denies sore throat and sinus problems.  Hematologic/Lymphatic:   Patient denies swollen glands and easy bruising.  Cardiovascular:   Patient reports leg swelling. Patient denies chest pains.  Respiratory:   Patient denies cough and shortness of breath.  Endocrine:   Patient denies excessive thirst.  Musculoskeletal:   Patient denies back pain and joint pain.  Neurological:   Patient denies headaches and dizziness.  Psychologic:   Patient denies depression and anxiety.   Notes: diarrhea due to meds leg swelling is chronic     VITAL SIGNS:      12/30/2020 02:04  PM  Weight 200 lb / 90.72 kg  Height 65 in / 165.1 cm  BP 131/82 mmHg  Pulse 71 /min  Temperature 97.5 F / 36.3 C  BMI 33.3 kg/m   MULTI-SYSTEM PHYSICAL EXAMINATION:    Constitutional: Well-nourished. No physical deformities. Normally developed. Good grooming.  Neck: Neck symmetrical, not swollen. Normal tracheal position.  Respiratory: Normal breath sounds. No labored breathing, no use of accessory muscles.   Cardiovascular: Regular rate and rhythm. II/VI murmur, no gallop.  Lymphatic: No enlargement of neck, axillae, groin.  Skin: No paleness, no jaundice, no cyanosis. No lesion, no ulcer, no rash.  Neurologic / Psychiatric: Oriented to time, oriented to place, oriented to person. No depression, no anxiety, no agitation.  Gastrointestinal: No mass, no tenderness, no rigidity, obese abdomen.   Eyes: Normal conjunctivae. Normal eyelids.  Ears, Nose, Mouth, and Throat: Left ear no scars, no lesions, no masses. Right ear no scars, no lesions, no masses. Nose no scars, no lesions, no masses. Normal hearing. Normal lips.  Musculoskeletal: Normal gait and station of head and neck.     Complexity of Data:  Records Review:   Previous Patient Records  Urine Test Review:   Urinalysis  12/30/20  Urinalysis  Urine Appearance Clear   Urine Color Yellow   Urine Glucose Neg mg/dL  Urine Bilirubin Neg mg/dL  Urine Ketones Neg mg/dL  Urine Specific Gravity 1.020   Urine Blood Neg ery/uL  Urine pH 5.5   Urine Protein Neg mg/dL  Urine Urobilinogen 0.2 mg/dL  Urine Nitrites Neg   Urine Leukocyte Esterase Neg leu/uL   PROCEDURES:          Urinalysis - 81003 Dipstick Dipstick Cont'd  Color: Yellow Bilirubin: Neg mg/dL  Appearance: Clear Ketones: Neg mg/dL  Specific Gravity: 1.020 Blood: Neg ery/uL  pH: 5.5 Protein: Neg mg/dL  Glucose: Neg mg/dL Urobilinogen: 0.2 mg/dL    Nitrites: Neg    Leukocyte Esterase: Neg leu/uL    ASSESSMENT:      ICD-10 Details  1 GU:   Left uncertain  neoplasm of kidney - D41.02    PLAN:           Schedule Return Visit/Planned Activity: Keep Scheduled Appointment - Schedule Surgery          Document Letter(s):  Created for Patient: Clinical Summary         Notes:   There are no changes in the patients history or physical exam since last evaluation by Dr. Alinda Money. Pt is scheduled to undergo left RAL partial nephrectomy 01/08/21.   Pt to discuss timing of endometrial biopsy and see if it can be delayed until after her kidney surgery.   All pt's questions were answered to the best of my ability.          Next Appointment:      Next Appointment: 01/08/2021 11:30 AM    Appointment Type: Surgery     Location: [facility] [phone]    Provider: Raynelle Bring, M.D.    Reason for Visit: WL/ EXT REC LT RA LAP PARTIAL NEPHRECTOMY      * Signed by Mcarthur Rossetti, PA on 12/30/20 at 3:21 PM (EDT)*

## 2021-01-08 ENCOUNTER — Encounter (HOSPITAL_COMMUNITY)
Admission: RE | Disposition: A | Discharge status: Home / Self Care | Payer: Self-pay | Source: Home / Self Care | Attending: Urology

## 2021-01-08 ENCOUNTER — Ambulatory Visit (HOSPITAL_COMMUNITY): Payer: Medicare Other | Admitting: Certified Registered Nurse Anesthetist

## 2021-01-08 ENCOUNTER — Observation Stay (HOSPITAL_COMMUNITY)
Admission: RE | Admit: 2021-01-08 | Discharge: 2021-01-10 | Disposition: A | Discharge status: Home / Self Care | Payer: Medicare Other | Attending: Urology | Admitting: Urology

## 2021-01-08 ENCOUNTER — Ambulatory Visit (HOSPITAL_COMMUNITY): Payer: Medicare Other | Admitting: Emergency Medicine

## 2021-01-08 ENCOUNTER — Other Ambulatory Visit: Payer: Self-pay

## 2021-01-08 ENCOUNTER — Encounter (HOSPITAL_COMMUNITY): Payer: Self-pay | Admitting: Urology

## 2021-01-08 DIAGNOSIS — E039 Hypothyroidism, unspecified: Secondary | ICD-10-CM | POA: Insufficient documentation

## 2021-01-08 DIAGNOSIS — D4102 Neoplasm of uncertain behavior of left kidney: Secondary | ICD-10-CM | POA: Diagnosis present

## 2021-01-08 DIAGNOSIS — I10 Essential (primary) hypertension: Secondary | ICD-10-CM | POA: Diagnosis not present

## 2021-01-08 DIAGNOSIS — Z7984 Long term (current) use of oral hypoglycemic drugs: Secondary | ICD-10-CM | POA: Insufficient documentation

## 2021-01-08 DIAGNOSIS — Z794 Long term (current) use of insulin: Secondary | ICD-10-CM | POA: Diagnosis not present

## 2021-01-08 DIAGNOSIS — D49512 Neoplasm of unspecified behavior of left kidney: Secondary | ICD-10-CM | POA: Diagnosis present

## 2021-01-08 DIAGNOSIS — Z79899 Other long term (current) drug therapy: Secondary | ICD-10-CM | POA: Diagnosis not present

## 2021-01-08 DIAGNOSIS — E119 Type 2 diabetes mellitus without complications: Secondary | ICD-10-CM | POA: Diagnosis not present

## 2021-01-08 HISTORY — PX: ROBOTIC ASSITED PARTIAL NEPHRECTOMY: SHX6087

## 2021-01-08 LAB — GLUCOSE, CAPILLARY
Glucose-Capillary: 181 mg/dL — ABNORMAL HIGH (ref 70–99)
Glucose-Capillary: 196 mg/dL — ABNORMAL HIGH (ref 70–99)
Glucose-Capillary: 270 mg/dL — ABNORMAL HIGH (ref 70–99)
Glucose-Capillary: 217 mg/dL — ABNORMAL HIGH (ref 70–99)

## 2021-01-08 LAB — HEMOGLOBIN AND HEMATOCRIT, BLOOD
HCT: 37.7 % (ref 36.0–46.0)
Hemoglobin: 12.9 g/dL (ref 12.0–15.0)

## 2021-01-08 LAB — BASIC METABOLIC PANEL
Anion gap: 8 (ref 5–15)
BUN: 13 mg/dL (ref 8–23)
CO2: 25 mmol/L (ref 22–32)
Calcium: 8.8 mg/dL — ABNORMAL LOW (ref 8.9–10.3)
Chloride: 104 mmol/L (ref 98–111)
Creatinine, Ser: 0.94 mg/dL (ref 0.44–1.00)
GFR, Estimated: >60 mL/min (ref >60)
Glucose, Bld: 251 mg/dL — ABNORMAL HIGH (ref 70–99)
Potassium: 4.4 mmol/L (ref 3.5–5.1)
Sodium: 137 mmol/L (ref 135–145)

## 2021-01-08 LAB — ABO/RH: ABO/RH(D): O POS

## 2021-01-08 SURGERY — NEPHRECTOMY, PARTIAL, ROBOT-ASSISTED
Anesthesia: General | Laterality: Left

## 2021-01-08 MED ORDER — SUFENTANIL CITRATE 50 MCG/ML IV SOLN
INTRAVENOUS | Status: DC | PRN
  Administered 2021-01-08 (×3): 10 ug via INTRAVENOUS

## 2021-01-08 MED ORDER — LEVOTHYROXINE SODIUM 100 MCG PO TABS
100.0000 ug | ORAL_TABLET | Freq: Every day | ORAL | Status: DC
  Administered 2021-01-09 – 2021-01-10 (×2): 100 ug via ORAL
  Filled 2021-01-08 (×2): qty 1

## 2021-01-08 MED ORDER — DIPHENHYDRAMINE HCL 50 MG/ML IJ SOLN: 12.5000 mg | Freq: Four times a day (QID) | INTRAMUSCULAR | Status: DC | PRN

## 2021-01-08 MED ORDER — ONDANSETRON HCL 4 MG/2ML IJ SOLN
INTRAMUSCULAR | Status: DC | PRN
  Administered 2021-01-08: 4 mg via INTRAVENOUS

## 2021-01-08 MED ORDER — BUPIVACAINE LIPOSOME 1.3 % IJ SUSP
INTRAMUSCULAR | Status: AC
  Filled 2021-01-08: qty 20

## 2021-01-08 MED ORDER — MIDAZOLAM HCL 2 MG/2ML IJ SOLN
INTRAMUSCULAR | Status: AC
  Filled 2021-01-08: qty 2

## 2021-01-08 MED ORDER — LACTATED RINGERS IV SOLN: INTRAVENOUS | Status: DC | PRN

## 2021-01-08 MED ORDER — DIPHENHYDRAMINE HCL 50 MG/ML IJ SOLN
INTRAMUSCULAR | Status: AC
  Filled 2021-01-08: qty 1

## 2021-01-08 MED ORDER — PHENYLEPHRINE 40 MCG/ML (10ML) SYRINGE FOR IV PUSH (FOR BLOOD PRESSURE SUPPORT)
PREFILLED_SYRINGE | INTRAVENOUS | Status: DC | PRN
  Administered 2021-01-08 (×2): 80 ug via INTRAVENOUS

## 2021-01-08 MED ORDER — INSULIN ASPART 100 UNIT/ML IJ SOLN
0.0000 [IU] | INTRAMUSCULAR | Status: DC
  Administered 2021-01-08: 5 [IU] via SUBCUTANEOUS
  Administered 2021-01-08: 8 [IU] via SUBCUTANEOUS
  Administered 2021-01-09: 3 [IU] via SUBCUTANEOUS
  Administered 2021-01-09: 5 [IU] via SUBCUTANEOUS
  Administered 2021-01-09 (×3): 3 [IU] via SUBCUTANEOUS
  Administered 2021-01-09: 15 [IU] via SUBCUTANEOUS
  Administered 2021-01-09 – 2021-01-10 (×3): 2 [IU] via SUBCUTANEOUS

## 2021-01-08 MED ORDER — CHLORHEXIDINE GLUCONATE 0.12 % MT SOLN
15.0000 mL | Freq: Once | OROMUCOSAL | Status: AC
  Administered 2021-01-08: 15 mL via OROMUCOSAL

## 2021-01-08 MED ORDER — PROPOFOL 10 MG/ML IV BOLUS
INTRAVENOUS | Status: DC | PRN
  Administered 2021-01-08: 14 mg via INTRAVENOUS

## 2021-01-08 MED ORDER — MEPERIDINE HCL 50 MG/ML IJ SOLN: 6.2500 mg | INTRAMUSCULAR | Status: DC | PRN

## 2021-01-08 MED ORDER — MORPHINE SULFATE (PF) 2 MG/ML IV SOLN
2.0000 mg | INTRAVENOUS | Status: DC | PRN
  Administered 2021-01-08 – 2021-01-09 (×3): 2 mg via INTRAVENOUS
  Filled 2021-01-08 (×3): qty 1

## 2021-01-08 MED ORDER — METOPROLOL SUCCINATE ER 25 MG PO TB24
25.0000 mg | ORAL_TABLET | Freq: Every day | ORAL | Status: DC
  Administered 2021-01-09 – 2021-01-10 (×2): 25 mg via ORAL
  Filled 2021-01-08 (×2): qty 1

## 2021-01-08 MED ORDER — ENALAPRIL MALEATE 2.5 MG PO TABS
2.5000 mg | ORAL_TABLET | Freq: Every day | ORAL | Status: DC
  Administered 2021-01-08 – 2021-01-10 (×3): 2.5 mg via ORAL
  Filled 2021-01-08 (×3): qty 1

## 2021-01-08 MED ORDER — DROPERIDOL 2.5 MG/ML IJ SOLN: 0.6250 mg | Freq: Once | INTRAMUSCULAR | Status: DC | PRN

## 2021-01-08 MED ORDER — ATORVASTATIN CALCIUM 40 MG PO TABS
40.0000 mg | ORAL_TABLET | Freq: Every day | ORAL | Status: DC
  Administered 2021-01-09 – 2021-01-10 (×2): 40 mg via ORAL
  Filled 2021-01-08 (×2): qty 1

## 2021-01-08 MED ORDER — LACTATED RINGERS IR SOLN
Status: DC | PRN
  Administered 2021-01-08: 1000 mL

## 2021-01-08 MED ORDER — SUFENTANIL CITRATE 50 MCG/ML IV SOLN
INTRAVENOUS | Status: AC
  Filled 2021-01-08: qty 1

## 2021-01-08 MED ORDER — SODIUM CHLORIDE 0.45 % IV SOLN: INTRAVENOUS | Status: DC

## 2021-01-08 MED ORDER — DEXAMETHASONE SODIUM PHOSPHATE 10 MG/ML IJ SOLN
INTRAMUSCULAR | Status: DC | PRN
  Administered 2021-01-08: 10 mg via INTRAVENOUS

## 2021-01-08 MED ORDER — VANCOMYCIN HCL IN DEXTROSE 1-5 GM/200ML-% IV SOLN
1000.0000 mg | Freq: Two times a day (BID) | INTRAVENOUS | Status: AC
  Administered 2021-01-08: 1000 mg via INTRAVENOUS
  Filled 2021-01-08: qty 200

## 2021-01-08 MED ORDER — ORAL CARE MOUTH RINSE
15.0000 mL | Freq: Two times a day (BID) | OROMUCOSAL | Status: DC
  Administered 2021-01-09: 15 mL via OROMUCOSAL

## 2021-01-08 MED ORDER — FLUTICASONE PROPIONATE 50 MCG/ACT NA SUSP
1.0000 | Freq: Every day | NASAL | Status: DC
  Administered 2021-01-09: 1 via NASAL
  Filled 2021-01-08: qty 16

## 2021-01-08 MED ORDER — ROCURONIUM BROMIDE 10 MG/ML (PF) SYRINGE
PREFILLED_SYRINGE | INTRAVENOUS | Status: AC
  Filled 2021-01-08: qty 10

## 2021-01-08 MED ORDER — VANCOMYCIN HCL IN DEXTROSE 1-5 GM/200ML-% IV SOLN
1000.0000 mg | Freq: Once | INTRAVENOUS | Status: AC
  Administered 2021-01-08: 1000 mg via INTRAVENOUS
  Filled 2021-01-08: qty 200

## 2021-01-08 MED ORDER — LACTATED RINGERS IV SOLN: INTRAVENOUS | Status: DC

## 2021-01-08 MED ORDER — BUPIVACAINE LIPOSOME 1.3 % IJ SUSP
INTRAMUSCULAR | Status: DC | PRN
  Administered 2021-01-08: 20 mL

## 2021-01-08 MED ORDER — DIPHENHYDRAMINE HCL 50 MG/ML IJ SOLN
INTRAMUSCULAR | Status: DC | PRN
  Administered 2021-01-08: 12.5 mg via INTRAVENOUS

## 2021-01-08 MED ORDER — ROCURONIUM BROMIDE 10 MG/ML (PF) SYRINGE
PREFILLED_SYRINGE | INTRAVENOUS | Status: DC | PRN
  Administered 2021-01-08: 20 mg via INTRAVENOUS
  Administered 2021-01-08: 70 mg via INTRAVENOUS

## 2021-01-08 MED ORDER — DOCUSATE SODIUM 100 MG PO CAPS
100.0000 mg | ORAL_CAPSULE | Freq: Two times a day (BID) | ORAL | Status: DC
  Administered 2021-01-09 – 2021-01-10 (×3): 100 mg via ORAL
  Filled 2021-01-08 (×4): qty 1

## 2021-01-08 MED ORDER — SODIUM CHLORIDE (PF) 0.9 % IJ SOLN
INTRAMUSCULAR | Status: DC | PRN
  Administered 2021-01-08: 20 mL

## 2021-01-08 MED ORDER — SODIUM CHLORIDE (PF) 0.9 % IJ SOLN
INTRAMUSCULAR | Status: AC
  Filled 2021-01-08: qty 10

## 2021-01-08 MED ORDER — PHENYLEPHRINE 40 MCG/ML (10ML) SYRINGE FOR IV PUSH (FOR BLOOD PRESSURE SUPPORT)
PREFILLED_SYRINGE | INTRAVENOUS | Status: AC
  Filled 2021-01-08: qty 10

## 2021-01-08 MED ORDER — LORATADINE 10 MG PO TABS
10.0000 mg | ORAL_TABLET | Freq: Every day | ORAL | Status: DC
  Administered 2021-01-09 – 2021-01-10 (×2): 10 mg via ORAL
  Filled 2021-01-08 (×2): qty 1

## 2021-01-08 MED ORDER — POLYETHYLENE GLYCOL 3350 17 G PO PACK: 17.0000 g | PACK | Freq: Every day | ORAL | Status: DC

## 2021-01-08 MED ORDER — LIDOCAINE HCL (PF) 2 % IJ SOLN
INTRAMUSCULAR | Status: AC
  Filled 2021-01-08: qty 5

## 2021-01-08 MED ORDER — SUGAMMADEX SODIUM 200 MG/2ML IV SOLN
INTRAVENOUS | Status: DC | PRN
  Administered 2021-01-08: 200 mg via INTRAVENOUS

## 2021-01-08 MED ORDER — HYDROMORPHONE HCL 1 MG/ML IJ SOLN
INTRAMUSCULAR | Status: AC
  Filled 2021-01-08: qty 1

## 2021-01-08 MED ORDER — DEXAMETHASONE SODIUM PHOSPHATE 10 MG/ML IJ SOLN
INTRAMUSCULAR | Status: AC
  Filled 2021-01-08: qty 1

## 2021-01-08 MED ORDER — PROPOFOL 10 MG/ML IV BOLUS
INTRAVENOUS | Status: AC
  Filled 2021-01-08: qty 20

## 2021-01-08 MED ORDER — STERILE WATER FOR IRRIGATION IR SOLN
Status: DC | PRN
  Administered 2021-01-08: 1000 mL

## 2021-01-08 MED ORDER — DOCUSATE SODIUM 100 MG PO CAPS: 100.0000 mg | ORAL_CAPSULE | Freq: Two times a day (BID) | ORAL | Status: DC

## 2021-01-08 MED ORDER — FUROSEMIDE 20 MG PO TABS
20.0000 mg | ORAL_TABLET | Freq: Every day | ORAL | Status: DC
  Administered 2021-01-09: 20 mg via ORAL
  Filled 2021-01-08 (×2): qty 1

## 2021-01-08 MED ORDER — MIDAZOLAM HCL 2 MG/2ML IJ SOLN
INTRAMUSCULAR | Status: DC | PRN
  Administered 2021-01-08: 2 mg via INTRAVENOUS

## 2021-01-08 MED ORDER — ONDANSETRON HCL 4 MG/2ML IJ SOLN: 4.0000 mg | INTRAMUSCULAR | Status: DC | PRN

## 2021-01-08 MED ORDER — TRAMADOL HCL 50 MG PO TABS: 50.0000 mg | ORAL_TABLET | Freq: Four times a day (QID) | ORAL | 0 refills | Status: DC | PRN

## 2021-01-08 MED ORDER — SODIUM CHLORIDE (PF) 0.9 % IJ SOLN
INTRAMUSCULAR | Status: AC
  Filled 2021-01-08: qty 20

## 2021-01-08 MED ORDER — ONDANSETRON HCL 4 MG/2ML IJ SOLN
INTRAMUSCULAR | Status: AC
  Filled 2021-01-08: qty 2

## 2021-01-08 MED ORDER — HYDROMORPHONE HCL 1 MG/ML IJ SOLN
0.2500 mg | INTRAMUSCULAR | Status: DC | PRN
  Administered 2021-01-08 (×2): 0.5 mg via INTRAVENOUS

## 2021-01-08 MED ORDER — LIDOCAINE 2% (20 MG/ML) 5 ML SYRINGE
INTRAMUSCULAR | Status: DC | PRN
  Administered 2021-01-08: 60 mg via INTRAVENOUS

## 2021-01-08 MED ORDER — DIPHENHYDRAMINE HCL 12.5 MG/5ML PO ELIX: 12.5000 mg | ORAL_SOLUTION | Freq: Four times a day (QID) | ORAL | Status: DC | PRN

## 2021-01-08 SURGICAL SUPPLY — 52 items
APPLICATOR SURGIFLO ENDO (HEMOSTASIS) ×2 IMPLANT
BAG COUNTER SPONGE SURGICOUNT (BAG) IMPLANT
CHLORAPREP W/TINT 26 (MISCELLANEOUS) ×2 IMPLANT
CLIP LIGATING HEMO O LOK GREEN (MISCELLANEOUS) ×6 IMPLANT
COVER SURGICAL LIGHT HANDLE (MISCELLANEOUS) ×2 IMPLANT
COVER TIP SHEARS 8 DVNC (MISCELLANEOUS) ×1 IMPLANT
COVER TIP SHEARS 8MM DA VINCI (MISCELLANEOUS) ×2
DECANTER SPIKE VIAL GLASS SM (MISCELLANEOUS) ×2 IMPLANT
DERMABOND ADVANCED (GAUZE/BANDAGES/DRESSINGS) ×1
DERMABOND ADVANCED .7 DNX12 (GAUZE/BANDAGES/DRESSINGS) ×1 IMPLANT
DRAIN CHANNEL 15F RND FF 3/16 (WOUND CARE) ×2 IMPLANT
DRAPE ARM DVNC X/XI (DISPOSABLE) ×4 IMPLANT
DRAPE COLUMN DVNC XI (DISPOSABLE) ×1 IMPLANT
DRAPE DA VINCI XI ARM (DISPOSABLE) ×8
DRAPE DA VINCI XI COLUMN (DISPOSABLE) ×2
DRAPE INCISE IOBAN 66X45 STRL (DRAPES) ×2 IMPLANT
DRAPE SHEET LG 3/4 BI-LAMINATE (DRAPES) ×2 IMPLANT
ELECT PENCIL ROCKER SW 15FT (MISCELLANEOUS) ×2 IMPLANT
ELECT REM PT RETURN 15FT ADLT (MISCELLANEOUS) ×2 IMPLANT
EVACUATOR SILICONE 100CC (DRAIN) ×2 IMPLANT
GLOVE SURG ENC MOIS LTX SZ6.5 (GLOVE) ×2 IMPLANT
GLOVE SURG ENC TEXT LTX SZ7.5 (GLOVE) ×4 IMPLANT
GOWN STRL REUS W/TWL LRG LVL3 (GOWN DISPOSABLE) ×6 IMPLANT
HEMOSTAT SURGICEL 4X8 (HEMOSTASIS) IMPLANT
HOLDER FOLEY CATH W/STRAP (MISCELLANEOUS) ×2 IMPLANT
IRRIG SUCT STRYKERFLOW 2 WTIP (MISCELLANEOUS) ×2
IRRIGATION SUCT STRKRFLW 2 WTP (MISCELLANEOUS) ×1 IMPLANT
KIT BASIN OR (CUSTOM PROCEDURE TRAY) ×2 IMPLANT
KIT TURNOVER KIT A (KITS) IMPLANT
POUCH SPECIMEN RETRIEVAL 10MM (ENDOMECHANICALS) ×2 IMPLANT
PROTECTOR NERVE ULNAR (MISCELLANEOUS) ×4 IMPLANT
SEAL CANN UNIV 5-8 DVNC XI (MISCELLANEOUS) ×4 IMPLANT
SEAL XI 5MM-8MM UNIVERSAL (MISCELLANEOUS) ×8
SET TUBE SMOKE EVAC HIGH FLOW (TUBING) ×2 IMPLANT
SOLUTION ELECTROLUBE (MISCELLANEOUS) ×2 IMPLANT
SURGIFLO W/THROMBIN 8M KIT (HEMOSTASIS) ×2 IMPLANT
SUT ETHILON 3 0 PS 1 (SUTURE) ×2 IMPLANT
SUT MNCRL AB 4-0 PS2 18 (SUTURE) ×4 IMPLANT
SUT PDS PLUS 0 (SUTURE) ×4
SUT PDS PLUS AB 0 CT-2 (SUTURE) ×2 IMPLANT
SUT V-LOC BARB 180 2/0GR6 GS22 (SUTURE) ×2
SUT VIC AB 0 CT1 27 (SUTURE) ×2
SUT VIC AB 0 CT1 27XBRD ANTBC (SUTURE) ×1 IMPLANT
SUT VLOC BARB 180 ABS3/0GR12 (SUTURE) ×2
SUTURE V-LC BRB 180 2/0GR6GS22 (SUTURE) ×1 IMPLANT
SUTURE VLOC BRB 180 ABS3/0GR12 (SUTURE) ×1 IMPLANT
TOWEL OR 17X26 10 PK STRL BLUE (TOWEL DISPOSABLE) ×2 IMPLANT
TOWEL OR NON WOVEN STRL DISP B (DISPOSABLE) ×2 IMPLANT
TRAY FOLEY MTR SLVR 16FR STAT (SET/KITS/TRAYS/PACK) ×2 IMPLANT
TRAY LAPAROSCOPIC (CUSTOM PROCEDURE TRAY) ×2 IMPLANT
TROCAR XCEL 12X100 BLDLESS (ENDOMECHANICALS) ×2 IMPLANT
WATER STERILE IRR 1000ML POUR (IV SOLUTION) ×2 IMPLANT

## 2021-01-08 NOTE — Anesthesia Postprocedure Evaluation (Signed)
Anesthesia Post Note  Patient: Laura Holland  Procedure(s) Performed: XI ROBOTIC ASSITED PARTIAL NEPHRECTOMY (Left)     Patient location during evaluation: PACU Anesthesia Type: General Level of consciousness: sedated and patient cooperative Pain management: pain level controlled Vital Signs Assessment: post-procedure vital signs reviewed and stable Respiratory status: spontaneous breathing Cardiovascular status: stable Anesthetic complications: no   No notable events documented.  Last Vitals:  Vitals:   01/08/21 1600 01/08/21 1625  BP: 133/69 (!) 142/79  Pulse: (!) 52 (!) 57  Resp: 15 16  Temp:  36.6 C  SpO2: 98% 100%    Last Pain:  Vitals:   01/08/21 1724  TempSrc:   PainSc: [place]

## 2021-01-08 NOTE — Op Note (Signed)
Preoperative diagnosis: Left renal neoplasm  Postoperative diagnosis: Left renal neoplasm  Procedure:  Left robotic-assisted laparoscopic partial nephrectomy  Surgeon: Pryor Curia. M.D.  Assistant(s): Debbrah Alar, PA-C  An assistant was required for this surgical procedure.  The duties of the assistant included but were not limited to suctioning, passing suture, camera manipulation, retraction. This procedure would not be able to be performed without an Environmental consultant.  Anesthesia: General  Complications: None  EBL: 100 mL  IVF:  1500 mL crystalloid  Specimens: Left renal neoplasm  Disposition of specimens: Pathology  Intraoperative findings:       1. Warm renal ischemia time: 12 minutes  Drains: # 15 Blake perinephric drain  Indication:  Laura Holland is a 75 y.o. year old patient with a left renal neoplasm.  After a thorough review of the management options for their renal mass, they elected to proceed with surgical treatment and the above procedure.  We have discussed the potential benefits and risks of the procedure, side effects of the proposed treatment, the likelihood of the patient achieving the goals of the procedure, and any potential problems that might occur during the procedure or recuperation. Informed consent has been obtained.   Description of procedure:  The patient was taken to the operating room and a general anesthetic was administered. The patient was given preoperative antibiotics, placed in the left modified flank position with care to pad all potential pressure points, and prepped and draped in the usual sterile fashion. Next a preoperative timeout was performed.  A site was selected in the upper midline for initial port placement. This was placed using a standard open Hassan technique which allowed entry into the peritoneal cavity under direct vision and without difficulty. A 12 mm port was placed and a pneumoperitoneum established. The camera  was then used to inspect the abdomen and there was no evidence of any intra-abdominal injuries or other abnormalities. The remaining abdominal ports were then placed. 8 mm robotic ports were placed in the left upper quadrant, left lower quadrant, and far left lateral abdominal wall. A 8 mm port was placed to the left of the midline just off the rectus muscle for the camera.. All ports were placed under direct vision without difficulty. The surgical cart was then docked.   Utilizing the cautery scissors, the white line of Toldt was incised allowing the colon to be mobilized medially and the plane between the mesocolon and the anterior layer of Gerota's fascia to be developed and the kidney to be exposed.  The ureter and gonadal vein were identified inferiorly and the ureter was lifted anteriorly off the psoas muscle.  Dissection proceeded superiorly along the gonadal vein until the renal vein was identified.  The renal hilum was then carefully isolated with a combination of blunt and sharp dissection allowing the renal arterial and venous structures to be separated and isolated in preparation for renal hilar vessel clamping. There were two renal arteries with the upper pole renal artery demonstrating a branch superiorly.   Attention turned to the kidney and the perinephric fat surrounding the renal mass was removed and the kidney was mobilized sufficiently for exposure and resection of the renal mass.   Once the renal mass was properly isolated, preparations were made for resection of the tumor.  Reconstructive sutures were placed into the abdomen for the renorrhaphy portion of the procedure.  The upper pole renal artery was then clamped with bulldog clamps.  The tumor was then excised with cold  scissor dissection along with an adequate visible gross margin of normal renal parenchyma. The tumor appeared to be excised without any gross violation of the tumor. The renal collecting system was not entered during  removal of the tumor.  A running 3-0 V-lock suture was then brought through the capsule of the kidney and run along the base of the renal defect to provide hemostasis and close any entry into the renal collecting system if present. Weck clips were used to secure this suture outside the renal capsule at the proximal and distal ends. An additional hemostatic agent (Surgiflo) was then placed into the renal defect. A running 2-0 V lock suture was then used to close the renal capsule using a sliding clip technique which resulted in excellent compression of the renal defect.    The bulldog clamps were then removed from the renal hilar vessel(s). Total warm renal ischemia time was 12 minutes. The renal tumor resection site was examined. Hemostasis appeared adequate.   The kidney was placed back into its normal anatomic position and covered with perinephric fat as needed.  A # 18 Blake drain was then brought through the lateral lower port site and positioned in the perinephric space.  It was secured to the skin with a nylon suture. The surgical cart was undocked.  The renal tumor specimen was removed intact within an endopouch retrieval bag via the upper midline port site.  All other laparoscopic/robotic ports had been removed under direct vision and the pneumoperitoneum let down with inspection of the operative field performed and hemostasis again confirmed. The upper midline incision was then closed at the fascial level with 0-vicryl suture. All incision sites were then injected with local anesthetic and reapproximated at the skin level with 4-0 monocryl subcuticular closures.  Liquiband was applied to the skin.  The patient tolerated the procedure well and without complications.  The patient was able to be extubated and transferred to the recovery unit in satisfactory condition.  Pryor Curia MD

## 2021-01-08 NOTE — Discharge Instructions (Signed)
Activity:  You are encouraged to ambulate frequently (about every hour during waking hours) to help prevent blood clots from forming in your legs or lungs.  However, you should not engage in any heavy lifting (> 10-15 lbs), strenuous activity, or straining. Diet: You should advance your diet as instructed by your physician.  It will be normal to have some bloating, nausea, and abdominal discomfort intermittently. Prescriptions:  You will be provided a prescription for pain medication to take as needed.  If your pain is not severe enough to require the prescription pain medication, you may take extra strength Tylenol instead which will have less side effects.  You should also take a prescribed stool softener to avoid straining with bowel movements as the prescription pain medication may constipate you. Incisions: You may remove your dressing bandages 48 hours after surgery if not removed in the hospital.  You will either have some small staples or special tissue glue at each of the incision sites. Once the bandages are removed (if present), the incisions may stay open to air.  You may start showering (but not soaking or bathing in water) the 2nd day after surgery and the incisions simply need to be patted dry after the shower.  No additional care is needed. What to call us about: You should call the office [phone]) if you develop fever > 101 or develop persistent vomiting.   You can resume aspirin, advil, aleve, vitamins, and supplements 7 days after surgery.

## 2021-01-08 NOTE — Progress Notes (Signed)
Patient ID: Laura Holland, female   DOB: [birth year], 75 y.o.   MRN: [id number]  Post-op note  Subjective: The patient is doing well.  No complaints.  Objective: Vital signs in last 24 hours: Temp:  [98.5 F (36.9 C)-98.7 F (37.1 C)] 98.7 F (37.1 C) (11/03 1423) Pulse Rate:  [53-63] 54 (11/03 1530) Resp:  [11-19] 12 (11/03 1530) BP: (136-152)/(59-80) 136/59 (11/03 1530) SpO2:  [90 %-100 %] 98 % (11/03 1530) Weight:  [90.9 kg] 90.9 kg (11/03 0951)  Intake/Output from previous day: No intake/output data recorded. Intake/Output this shift: Total I/O In: 1500 [I.V.:1500] Out: 225 [Urine:125; Blood:100]  Physical Exam:  General: Alert and oriented. Abdomen: Soft, Nondistended. Incisions: Clean and dry.  Lab Results: No results for input(s): HGB, HCT in the last 72 hours.  Assessment/Plan: POD#0   1) Continue to monitor, bedrest tonight   Laura Holland. MD   LOS: 0 days   Laura Holland 01/08/2021, 4:17 PM

## 2021-01-08 NOTE — Anesthesia Procedure Notes (Signed)
Procedure Name: Intubation Date/Time: 01/08/2021 12:00 PM Performed by: Sharlette Dense, CRNA Pre-anesthesia Checklist: Patient identified, Emergency Drugs available, Suction available and Patient being monitored Patient Re-evaluated:Patient Re-evaluated prior to induction Oxygen Delivery Method: Circle system utilized Preoxygenation: Pre-oxygenation with 100% oxygen Induction Type: IV induction Ventilation: Mask ventilation without difficulty and Oral airway inserted - appropriate to patient size Laryngoscope Size: Sabra Heck and 2 Grade View: Grade II Tube type: Oral Tube size: 7.5 mm Number of attempts: 1 Airway Equipment and Method: Stylet Placement Confirmation: ETT inserted through vocal cords under direct vision, positive ETCO2 and breath sounds checked- equal and bilateral Secured at: 22 cm Tube secured with: Tape Dental Injury: Teeth and Oropharynx as per pre-operative assessment

## 2021-01-08 NOTE — Plan of Care (Signed)
  Problem: Activity: Goal: Risk for activity intolerance will decrease Outcome: Progressing   Problem: Pain Managment: Goal: General experience of comfort will improve Outcome: Progressing   Problem: Education: Goal: Knowledge of the prescribed therapeutic regimen will improve Outcome: Progressing   Problem: Education: Goal: Knowledge of General Education information will improve Description: Including pain rating scale, medication(s)/side effects and non-pharmacologic comfort measures Outcome: Completed/Met

## 2021-01-08 NOTE — Transfer of Care (Signed)
Immediate Anesthesia Transfer of Care Note  Patient: Laura Holland  Procedure(s) Performed: XI ROBOTIC ASSITED PARTIAL NEPHRECTOMY (Left)  Patient Location: PACU  Anesthesia Type:General  Level of Consciousness: drowsy  Airway & Oxygen Therapy: Patient Spontanous Breathing and Patient connected to face mask oxygen  Post-op Assessment: Report given to RN and Post -op Vital signs reviewed and stable  Post vital signs: Reviewed and stable  Last Vitals:  Vitals Value Taken Time  BP 142/80 01/08/21 1423  Temp    Pulse 65 01/08/21 1425  Resp 19 01/08/21 1425  SpO2 100 % 01/08/21 1425  Vitals shown include unvalidated device data.  Last Pain:  Vitals:   01/08/21 0951  TempSrc:   PainSc: 0-No pain         Complications: No notable events documented.

## 2021-01-08 NOTE — Interval H&P Note (Signed)
History and Physical Interval Note:  01/08/2021 10:52 AM  Laura Holland  has presented today for surgery, with the diagnosis of LEFT RENAL NEOPLASM.  The various methods of treatment have been discussed with the patient and family. After consideration of risks, benefits and other options for treatment, the patient has consented to  Procedure(s): XI ROBOTIC ASSITED PARTIAL NEPHRECTOMY (Left) as a surgical intervention.  The patient's history has been reviewed, patient examined, no change in status, stable for surgery.  I have reviewed the patient's chart and labs.  Questions were answered to the patient's satisfaction.     [employer]

## 2021-01-09 ENCOUNTER — Encounter (HOSPITAL_COMMUNITY): Payer: Self-pay | Admitting: Urology

## 2021-01-09 DIAGNOSIS — D4102 Neoplasm of uncertain behavior of left kidney: Secondary | ICD-10-CM | POA: Diagnosis not present

## 2021-01-09 LAB — GLUCOSE, CAPILLARY
Glucose-Capillary: 143 mg/dL — ABNORMAL HIGH (ref 70–99)
Glucose-Capillary: 168 mg/dL — ABNORMAL HIGH (ref 70–99)
Glucose-Capillary: 180 mg/dL — ABNORMAL HIGH (ref 70–99)
Glucose-Capillary: 189 mg/dL — ABNORMAL HIGH (ref 70–99)
Glucose-Capillary: 194 mg/dL — ABNORMAL HIGH (ref 70–99)
Glucose-Capillary: 225 mg/dL — ABNORMAL HIGH (ref 70–99)
Glucose-Capillary: 364 mg/dL — ABNORMAL HIGH (ref 70–99)

## 2021-01-09 LAB — CREATININE, FLUID (PLEURAL, PERITONEAL, JP DRAINAGE): Creat, Fluid: 0.9 mg/dL

## 2021-01-09 LAB — BASIC METABOLIC PANEL
Anion gap: 8 (ref 5–15)
BUN: 13 mg/dL (ref 8–23)
CO2: 25 mmol/L (ref 22–32)
Calcium: 8.5 mg/dL — ABNORMAL LOW (ref 8.9–10.3)
Chloride: 104 mmol/L (ref 98–111)
Creatinine, Ser: 1.11 mg/dL — ABNORMAL HIGH (ref 0.44–1.00)
GFR, Estimated: 52 mL/min — ABNORMAL LOW (ref >60)
Glucose, Bld: 185 mg/dL — ABNORMAL HIGH (ref 70–99)
Potassium: 3.9 mmol/L (ref 3.5–5.1)
Sodium: 137 mmol/L (ref 135–145)

## 2021-01-09 LAB — HEMOGLOBIN AND HEMATOCRIT, BLOOD
HCT: 32.3 % — ABNORMAL LOW (ref 36.0–46.0)
Hemoglobin: 11.2 g/dL — ABNORMAL LOW (ref 12.0–15.0)

## 2021-01-09 MED ORDER — CHLORHEXIDINE GLUCONATE CLOTH 2 % EX PADS: 6.0000 | MEDICATED_PAD | Freq: Every day | CUTANEOUS | Status: DC

## 2021-01-09 MED ORDER — MENTHOL 3 MG MT LOZG: 1.0000 | LOZENGE | OROMUCOSAL | Status: DC | PRN

## 2021-01-09 MED ORDER — BISACODYL 10 MG RE SUPP
10.0000 mg | Freq: Once | RECTAL | Status: AC
  Administered 2021-01-09: 10 mg via RECTAL
  Filled 2021-01-09: qty 1

## 2021-01-09 MED ORDER — ACETAMINOPHEN 325 MG PO TABS
650.0000 mg | ORAL_TABLET | ORAL | Status: DC | PRN
  Administered 2021-01-09 – 2021-01-10 (×4): 650 mg via ORAL
  Filled 2021-01-09 (×4): qty 2

## 2021-01-09 MED ORDER — TRAMADOL HCL 50 MG PO TABS
50.0000 mg | ORAL_TABLET | Freq: Four times a day (QID) | ORAL | Status: DC | PRN
  Administered 2021-01-09: 50 mg via ORAL
  Filled 2021-01-09 (×2): qty 1

## 2021-01-09 NOTE — Progress Notes (Signed)
Patient ID: Laura Holland, female   DOB: 1945-11-02, 75 y.o.   MRN: [id number]  1 Day Post-Op Subjective: Doing well.  Pain controlled.  No nausea.  No flatus.  Objective: Vital signs in last 24 hours: Temp:  [97.9 F (36.6 C)-98.7 F (37.1 C)] 98 F (36.7 C) (11/04 0352) Pulse Rate:  [52-63] 62 (11/04 0352) Resp:  [11-19] 18 (11/04 0352) BP: (115-152)/(48-80) 115/48 (11/04 0352) SpO2:  [90 %-100 %] 98 % (11/04 0352) Weight:  [90.9 kg] 90.9 kg (11/03 0951)  Intake/Output from previous day: 11/03 0701 - 11/04 0700 In: 1909.3 [P.O.:240; I.V.:1669.3] Out: 2105 [Urine:1850; Drains:155; Blood:100] Intake/Output this shift: No intake/output data recorded.  Physical Exam:  General: Alert and oriented Abdomen: Soft, ND, positive BS Incisions: C/D/I Ext: NT, No erythema  Lab Results: Recent Labs    01/08/21 1754 01/09/21 0433  HGB 12.9 11.2*  HCT 37.7 32.3*   BMET Recent Labs    01/08/21 1754 01/09/21 0433  NA 137 137  K 4.4 3.9  CL 104 104  CO2 25 25  GLUCOSE 251* 185*  BUN 13 13  CREATININE 0.94 1.11*  CALCIUM 8.8* 8.5*     Studies/Results: No results found.  Assessment/Plan: POD # 1 s/p left RAL partial nephrectomy - Ambulate, IS - Advance diet - Oral pain medication - Path pending - Follow renal function   LOS: 0 days   Dutch Gray 01/09/2021, 7:33 AM

## 2021-01-09 NOTE — TOC Initial Note (Signed)
Transition of Care [facility]) - Initial/Assessment Note    Patient Details  Name: Laura Holland MRN: [id number] Date of Birth: [birth year]  Transition of Care [facility]) CM/SW Contact:    Leeroy Cha, RN Phone Number: 01/09/2021, 2:02 PM  Clinical Narrative:                 Pod 1 Preoperative diagnosis: Left renal neoplasm   Postoperative diagnosis: Left renal neoplasm   Procedure:   Left robotic-assisted laparoscopic partial nephrectomy TOC PLAN OF CARE: Following for toc needs and progression. Expected Discharge Plan: Home/Self Care Barriers to Discharge: Continued Medical Work up   Patient Goals and CMS Choice Patient states their goals for this hospitalization and ongoing recovery are:: to go home and be with my family CMS Medicare.gov Compare Post Acute Care list provided to:: Patient    Expected Discharge Plan and Services Expected Discharge Plan: Home/Self Care   Discharge Planning Services: CM Consult   Living arrangements for the past 2 months: Single Family Home                                      Prior Living Arrangements/Services Living arrangements for the past 2 months: Single Family Home Lives with:: Spouse Patient language and need for interpreter reviewed:: Yes Do you feel safe going back to the place where you live?: Yes            Criminal Activity/Legal Involvement Pertinent to Current Situation/Hospitalization: No - Comment as needed  Activities of Daily Living Home Assistive Devices/Equipment: CBG Meter, Eyeglasses ADL Screening (condition at time of admission) Patient's cognitive ability adequate to safely complete daily activities?: Yes Is the patient deaf or have difficulty hearing?: Yes Does the patient have difficulty seeing, even when wearing glasses/contacts?: No Does the patient have difficulty concentrating, remembering, or making decisions?: Yes Patient able to express need for assistance with ADLs?: Yes Does the patient  have difficulty dressing or bathing?: No Independently performs ADLs?: Yes (appropriate for developmental age) Does the patient have difficulty walking or climbing stairs?: No Weakness of Legs: None Weakness of Arms/Hands: Right  Permission Sought/Granted                  Emotional Assessment Appearance:: Appears stated age Attitude/Demeanor/Rapport: Engaged Affect (typically observed): Calm Orientation: : Oriented to Place, Oriented to Self, Oriented to  Time, Oriented to Situation Alcohol / Substance Use: Not Applicable Psych Involvement: No (comment)  Admission diagnosis:  Neoplasm of left kidney [D49.512] Patient Active Problem List   Diagnosis Date Noted   Neoplasm of left kidney 01/08/2021   Right carpal tunnel syndrome 12/11/2019   Sagittal band rupture, extensor tendon, nontraumatic, 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

## 2021-01-09 NOTE — Plan of Care (Signed)
  Problem: Activity: Goal: Risk for activity intolerance will decrease Outcome: Progressing   Problem: Elimination: Goal: Will not experience complications related to bowel motility Outcome: Progressing   Problem: Clinical Measurements: Goal: Respiratory complications will improve Outcome: Completed/Met   Problem: Nutrition: Goal: Adequate nutrition will be maintained Outcome: Completed/Met   Problem: Elimination: Goal: Will not experience complications related to urinary retention Outcome: Completed/Met

## 2021-01-09 NOTE — Progress Notes (Signed)
Patient ID: Laura Holland, female   DOB: 05/12/1945, 75 y.o.   MRN: [id number]  Pt doing well.  Ambulating and pain controlled with oral medication.  Tolerating diet.  Drain Cr 0.9 - will be removed.  SL IVF.  Plan for discharge in the morning tomorrow.

## 2021-01-10 DIAGNOSIS — D4102 Neoplasm of uncertain behavior of left kidney: Secondary | ICD-10-CM | POA: Diagnosis not present

## 2021-01-10 LAB — BASIC METABOLIC PANEL
Anion gap: 6 (ref 5–15)
BUN: 15 mg/dL (ref 8–23)
CO2: 29 mmol/L (ref 22–32)
Calcium: 8.6 mg/dL — ABNORMAL LOW (ref 8.9–10.3)
Chloride: 105 mmol/L (ref 98–111)
Creatinine, Ser: 1.01 mg/dL — ABNORMAL HIGH (ref 0.44–1.00)
GFR, Estimated: 59 mL/min — ABNORMAL LOW (ref >60)
Glucose, Bld: 153 mg/dL — ABNORMAL HIGH (ref 70–99)
Potassium: 3.4 mmol/L — ABNORMAL LOW (ref 3.5–5.1)
Sodium: 140 mmol/L (ref 135–145)

## 2021-01-10 LAB — GLUCOSE, CAPILLARY
Glucose-Capillary: 147 mg/dL — ABNORMAL HIGH (ref 70–99)
Glucose-Capillary: 147 mg/dL — ABNORMAL HIGH (ref 70–99)

## 2021-01-10 LAB — HEMOGLOBIN AND HEMATOCRIT, BLOOD
HCT: 30.6 % — ABNORMAL LOW (ref 36.0–46.0)
Hemoglobin: 10.2 g/dL — ABNORMAL LOW (ref 12.0–15.0)

## 2021-01-10 NOTE — Discharge Summary (Signed)
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 Briefly, Laura Holland is a 75 y.o. year old patient with a 2cm left renal mass who underwent robotic left partial nephrectomy on 01/08/21.   Hospital Course: Patient tolerated the procedure well.  She was then transferred to the floor after an uneventful PACU stay.  Her hospital course was uncomplicated.  Her diet was advanced on POD1 without nausea and vomiting and she was able to ambulate independently, foley was removed and she passed a TOV. JP creatinine was negative so her drain was removed. On POD#2 she had met discharge criteria: was eating a regular diet, was up and ambulating independently,  pain was well controlled, was voiding without a catheter, and was ready to for discharge.   Laboratory values:  Recent Labs    01/08/21 1754 01/09/21 0433 01/10/21 0500  HGB 12.9 11.2* 10.2*  HCT 37.7 32.3* 30.6*   Recent Labs    01/08/21 1754 01/09/21 0433 01/10/21 0500  NA 137 137 140  K 4.4 3.9 3.4*  CL 104 104 105  CO2 '25 25 29  ' GLUCOSE 251* 185* 153*  BUN '13 13 15  ' CREATININE 0.94 1.11* 1.01*  CALCIUM 8.8* 8.5* 8.6*   No results for input(s): LABPT, INR in the last 72 hours. No results for input(s): LABURIN in the last 72 hours. Results for orders placed or performed in visit on 01/05/21  SARS Coronavirus 2 (TAT 6-24 hrs)     Status: None   Collection Time: 01/05/21 12:00 AM  Result Value Ref Range Status   SARS Coronavirus 2 RESULT: NEGATIVE  Final     Comment: RESULT: NEGATIVESARS-CoV-2 INTERPRETATION:A NEGATIVE  test result means that SARS-CoV-2 RNA was not present in the specimen above the limit of detection of this test. This does not preclude a possible SARS-CoV-2 infection and should not be used as the  sole basis for patient management decisions. Negative results must be combined with clinical observations, patient history, and epidemiological information. Optimum specimen types and timing for peak viral levels during infections caused by SARS-CoV-2  have not been determined. Collection of multiple specimens or types of specimens may be necessary to detect virus. Improper specimen collection and handling, sequence variability under primers/probes, or organism present below the limit of detection may  lead to false negative results. Positive and negative predictive values of testing are highly dependent on prevalence. False negative test results are more likely when prevalence of disease is high.The expected result is NEGATIVE.Fact S heet for  Healthcare Providers: LocalChronicle.no Sheet for Patients: SalonLookup.es Reference Range - Negative     Disposition: Home  Discharge instruction: The patient was instructed to be ambulatory but told to refrain from heavy lifting, strenuous activity, or driving.   Discharge medications:  Allergies as of 01/10/2021       Reactions   Canagliflozin Other (See Comments)   Other reaction(s): yeast   Epinephrine Palpitations   Novocain [procaine] Palpitations   Penicillins Rash        Medication List     STOP taking these medications  Magnesium 400 MG Caps   multivitamin with minerals tablet       TAKE these medications    atorvastatin 40 MG tablet Commonly known as: LIPITOR Take 40 mg by mouth daily.   Calcium Carb-Cholecalciferol 600-800 MG-UNIT Chew Chew 600 mg by mouth daily.   docusate sodium 100 MG  capsule Commonly known as: COLACE Take 1 capsule (100 mg total) by mouth 2 (two) times daily.   enalapril 2.5 MG tablet Commonly known as: VASOTEC Take 2.5 mg by mouth daily.   fluticasone 50 MCG/ACT nasal spray Commonly known as: FLONASE Place into both nostrils daily.   furosemide 20 MG tablet Commonly known as: LASIX TAKE 1 TABLET (20 MG TOTAL) BY MOUTH IN THE MORNING.   Insulin Lispro Prot & Lispro (75-25) 100 UNIT/ML Kwikpen Commonly known as: HUMALOG 75/25 MIX Inject 15-25 Units into the skin See admin instructions. Take 25 units in the morning and 15 unit the evening   levothyroxine 100 MCG tablet Commonly known as: SYNTHROID Take 100 mcg by mouth daily before breakfast.   loratadine 10 MG tablet Commonly known as: CLARITIN Take 10 mg by mouth daily.   metFORMIN 1000 MG tablet Commonly known as: GLUCOPHAGE Take 1,000 mg by mouth 2 (two) times daily with a meal.   metoprolol succinate 25 MG 24 hr tablet Commonly known as: Toprol XL Take 1 tablet (25 mg total) by mouth daily. Hold if systolic blood pressure (top blood pressure number) less than 100 mmHg or heart rate less than 60 bpm (pulse).   ondansetron 4 MG tablet Commonly known as: Zofran Take 1 tablet (4 mg total) by mouth every 8 (eight) hours as needed for nausea or vomiting.   Rybelsus 7 MG Tabs Generic drug: Semaglutide Take 7 mg by mouth every morning.   traMADol 50 MG tablet Commonly known as: Ultram Take 1-2 tablets (50-100 mg total) by mouth every 6 (six) hours as needed for moderate pain or severe pain.        Followup:   Follow-up Information     Raynelle Bring, MD Follow up on 02/06/2021.   Specialty: Urology Why: at 8:30 Contact information: [place] [address] [phone]

## 2021-01-14 LAB — SURGICAL PATHOLOGY

## 2021-03-17 ENCOUNTER — Other Ambulatory Visit: Payer: Self-pay

## 2021-03-17 ENCOUNTER — Encounter: Payer: Self-pay | Admitting: Cardiology

## 2021-03-17 ENCOUNTER — Ambulatory Visit: Payer: Medicare Other | Admitting: Cardiology

## 2021-03-17 VITALS — BP 139/69 | HR 67 | Temp 97.5°F | Resp 16 | Ht 65.0 in | Wt 202.8 lb

## 2021-03-17 DIAGNOSIS — R002 Palpitations: Secondary | ICD-10-CM

## 2021-03-17 DIAGNOSIS — Z794 Long term (current) use of insulin: Secondary | ICD-10-CM

## 2021-03-17 DIAGNOSIS — E1140 Type 2 diabetes mellitus with diabetic neuropathy, unspecified: Secondary | ICD-10-CM

## 2021-03-17 DIAGNOSIS — I493 Ventricular premature depolarization: Secondary | ICD-10-CM

## 2021-03-17 DIAGNOSIS — I10 Essential (primary) hypertension: Secondary | ICD-10-CM

## 2021-03-17 DIAGNOSIS — Z87891 Personal history of nicotine dependence: Secondary | ICD-10-CM

## 2021-03-17 DIAGNOSIS — E6609 Other obesity due to excess calories: Secondary | ICD-10-CM

## 2021-03-17 DIAGNOSIS — E782 Mixed hyperlipidemia: Secondary | ICD-10-CM

## 2021-03-17 NOTE — Progress Notes (Signed)
Laura Holland Date of Birth: [birth year] MRN: [id number] Primary Care Provider:Smith, Hal Hope, MD Primary Cardiologist: Rex Kras, DO (established care 06/13/2019)  Date: 03/17/21 Last Office Visit: 03/17/2020  Chief Complaint  Patient presents with   Palpitations   Follow-up    HPI  Laura Holland is a 76 y.o. female who presents to the office with a chief complaint of " 1 yr follow-up for palpitations." Patient's past medical history and cardiac risk factors include: Insulin-dependent diabetes mellitus type 2, hypertension, hyperlipidemia, postmenopausal female, advanced age, former smoker, obesity.  Initially seen in consultation in April 2021 for palpitations and PVCs.  She was diagnosed with PVCs back in 1970s and was started on metoprolol.  After recommending reducing caffeine intake.  PVC/palpitations reduces significantly.  She underwent a 24-hour Holter monitor which noted an average heart rate of 66 bpm, no atrial fibrillation during the monitoring period.  She now presents for 1 year follow-up visit.  Patient states that the palpitations are relatively stable.  She continues to be on metoprolol 25 mg p.o. daily.   Since last office visit no hospitalizations or urgent care visits for cardiovascular symptoms.  Patient has undergone carpal tunnel release surgery as well as partial nephrectomy in 2022 since last office visit.  ALLERGIES: Allergies  Allergen Reactions   Canagliflozin Other (See Comments)    Other reaction(s): yeast   Epinephrine Palpitations   Novocain [Procaine] Palpitations   Penicillins Rash   MEDICATION LIST PRIOR TO VISIT: Current Outpatient Medications on File Prior to Visit  Medication Sig Dispense Refill   atorvastatin (LIPITOR) 40 MG tablet Take 40 mg by mouth daily.     Calcium Carb-Cholecalciferol 600-800 MG-UNIT CHEW Chew 600 mg by mouth daily.     enalapril (VASOTEC) 2.5 MG tablet Take 2.5 mg by mouth daily.     fluticasone (FLONASE) 50  MCG/ACT nasal spray Place into both nostrils daily.     furosemide (LASIX) 20 MG tablet TAKE 1 TABLET (20 MG TOTAL) BY MOUTH IN THE MORNING. 90 tablet 2   Insulin Lispro Prot & Lispro (HUMALOG 75/25 MIX) (75-25) 100 UNIT/ML Kwikpen Inject 15-25 Units into the skin See admin instructions. Take 25 units in the morning and 15 unit the evening     levothyroxine (SYNTHROID) 100 MCG tablet Take 100 mcg by mouth daily before breakfast.     loratadine (CLARITIN) 10 MG tablet Take 10 mg by mouth daily.     metFORMIN (GLUCOPHAGE) 1000 MG tablet Take 1,000 mg by mouth 2 (two) times daily with a meal.     metoprolol succinate (TOPROL XL) 25 MG 24 hr tablet Take 1 tablet (25 mg total) by mouth daily. Hold if systolic blood pressure (top blood pressure number) less than 100 mmHg or heart rate less than 60 bpm (pulse). 90 tablet 1   RYBELSUS 7 MG TABS Take 7 mg by mouth every morning.     No current facility-administered medications on file prior to visit.    PAST MEDICAL HISTORY: Past Medical History:  Diagnosis Date   Arthritis    Cancer ([place])    Diabetes mellitus without complication (HCC)    Glaucoma    Heart murmur    High cholesterol    Hypertension    Hypothyroid    PONV (postoperative nausea and vomiting)     PAST SURGICAL HISTORY: Past Surgical History:  Procedure Laterality Date   APPENDECTOMY     CATARACT EXTRACTION, BILATERAL  2020   CESAREAN SECTION  CHOLECYSTECTOMY     dental implants  08/2019   EYE SURGERY     related to glaucoma   IR RADIOLOGIST EVAL & MGMT  10/07/2020   LAMINECTOMY     right carpal tunnel      ROBOTIC ASSITED PARTIAL NEPHRECTOMY Left 01/08/2021   Procedure: XI ROBOTIC ASSITED PARTIAL NEPHRECTOMY;  Surgeon: Raynelle Bring, MD;  Location: [facility];  Service: Urology;  Laterality: Left;   TONSILLECTOMY      FAMILY HISTORY: The patient family history includes Arthritis in her mother; Breast cancer in her mother and sister; Heart attack in her sister;  Hyperlipidemia in her brother and father; Hypertension in her brother and father; Lung cancer in her father and sister; Thyroid cancer in her mother.   SOCIAL HISTORY:  The patient  reports that she quit smoking about 58 years ago. Her smoking use included cigarettes. She has a 0.50 pack-year smoking history. She has never used smokeless tobacco. She reports that she does not currently use alcohol. She reports that she does not use drugs.  REVIEW OF SYSTEMS: Review of Systems  Constitutional: Negative for chills and fever.  HENT:  Negative for ear discharge, ear pain and nosebleeds.   Eyes:  Negative for blurred vision and discharge.  Cardiovascular:  Negative for chest pain, claudication, dyspnea on exertion, leg swelling, near-syncope, orthopnea, palpitations, paroxysmal nocturnal dyspnea and syncope.  Respiratory:  Negative for cough and shortness of breath.   Endocrine: Negative for polydipsia, polyphagia and polyuria.  Hematologic/Lymphatic: Negative for bleeding problem.  Skin:  Negative for flushing and nail changes.  Musculoskeletal:  Negative for muscle cramps, muscle weakness and myalgias.  Gastrointestinal:  Negative for abdominal pain, dysphagia, hematemesis, hematochezia, melena, nausea and vomiting.  Neurological:  Negative for dizziness, focal weakness and light-headedness.  PHYSICAL EXAM: Vitals with BMI 03/17/2021 01/10/2021 01/09/2021  Height '5\' 5"'  - -  Weight 202 lbs 13 oz - -  BMI 00.71 - -  Systolic 219 758 832  Diastolic 69 55 47  Pulse 67 62 69   CONSTITUTIONAL: Well-developed and well-nourished. No acute distress.  SKIN: Skin is warm and dry. No rash noted. No cyanosis. No pallor. No jaundice HEAD: Normocephalic and atraumatic.  EYES: No scleral icterus MOUTH/THROAT: Moist oral membranes.  NECK: No JVD present. No thyromegaly noted. No carotid bruits  LYMPHATIC: No visible cervical adenopathy.  CHEST Normal respiratory effort. No intercostal retractions  LUNGS:  Clear to auscultation bilaterally.  No stridor. No wheezes. No rales.  CARDIOVASCULAR: Regular rate and rhythm, positive S1-S2, no murmurs rubs or gallops appreciated. ABDOMINAL: Obese, soft, nontender, nondistended, positive bowel sounds in all 4 quadrants.  No apparent ascites.  EXTREMITIES: Trace Bilateralperipheral edema, warm to touch bilaterally. HEMATOLOGIC: No significant bruising NEUROLOGIC: Oriented to person, place, and time. Nonfocal. Normal muscle tone.  PSYCHIATRIC: Normal mood and affect. Normal behavior. Cooperative  CARDIAC DATABASE: EKG: 03/17/2021: NSR, 61 bpm, without underlying ischemia or injury pattern.   Echocardiogram: 06/27/2019: LVEF 65%, mild LVH, grade 2 diastolic impairment, mildly dilated left atrium, elevated LAP, mild MR, mild TR, no pulmonary hypertension.  Stress Testing: Lexiscan Tetrofosmin Stress Test  06/27/2019: Myocardial perfusion is normal. Overall LV systolic function is normal without regional wall motion abnormalities. Stress LV EF: 56%.  Heart Catheterization: None  24 hour Holter monitor:  Dominant rhythm normal sinus rhythm.  Heart rate 45-112 bpm.  Average heart rate 66 bpm.  No atrial fibrillation/ sinus pause greater than or equal to 2.5 seconds in duration.  Ventricular ectopy  was 15 beats, with 1 ventricular pairs and 0 ventricular runs, total ventricular ectopic burden 0.01%.  Supraventricular ectopy was a 71 beats, with 5 supraventricular runs.  The longest and the fastest supraventricular run was 3 beats in duration 115 bpm.  Total supraventricular ectopic burden 0.07%.  Number of patient triggered events: 1, which correlated to normal sinus rhythm.  LABORATORY DATA: External Labs: Collected: May 28, 2019 Creatinine 0.71 mg/dL. eGFR: 81 mL/min per 1.73 m TSH: 1.31  FINAL MEDICATION LIST END OF ENCOUNTER: No orders of the defined types were placed in this encounter.   Medications Discontinued During This Encounter   Medication Reason   ondansetron (ZOFRAN) 4 MG tablet    docusate sodium (COLACE) 100 MG capsule    traMADol (ULTRAM) 50 MG tablet      Current Outpatient Medications:    atorvastatin (LIPITOR) 40 MG tablet, Take 40 mg by mouth daily., Disp: , Rfl:    Calcium Carb-Cholecalciferol 600-800 MG-UNIT CHEW, Chew 600 mg by mouth daily., Disp: , Rfl:    enalapril (VASOTEC) 2.5 MG tablet, Take 2.5 mg by mouth daily., Disp: , Rfl:    fluticasone (FLONASE) 50 MCG/ACT nasal spray, Place into both nostrils daily., Disp: , Rfl:    furosemide (LASIX) 20 MG tablet, TAKE 1 TABLET (20 MG TOTAL) BY MOUTH IN THE MORNING., Disp: 90 tablet, Rfl: 2   Insulin Lispro Prot & Lispro (HUMALOG 75/25 MIX) (75-25) 100 UNIT/ML Kwikpen, Inject 15-25 Units into the skin See admin instructions. Take 25 units in the morning and 15 unit the evening, Disp: , Rfl:    levothyroxine (SYNTHROID) 100 MCG tablet, Take 100 mcg by mouth daily before breakfast., Disp: , Rfl:    loratadine (CLARITIN) 10 MG tablet, Take 10 mg by mouth daily., Disp: , Rfl:    metFORMIN (GLUCOPHAGE) 1000 MG tablet, Take 1,000 mg by mouth 2 (two) times daily with a meal., Disp: , Rfl:    metoprolol succinate (TOPROL XL) 25 MG 24 hr tablet, Take 1 tablet (25 mg total) by mouth daily. Hold if systolic blood pressure (top blood pressure number) less than 100 mmHg or heart rate less than 60 bpm (pulse)., Disp: 90 tablet, Rfl: 1   RYBELSUS 7 MG TABS, Take 7 mg by mouth every morning., Disp: , Rfl:   IMPRESSION:    ICD-10-CM   1. Palpitations  R00.2 EKG 12-Lead    2. Benign hypertension  I10     3. Type 2 diabetes mellitus with diabetic neuropathy, with long-term current use of insulin (HCC)  E11.40    Z79.4     4. Long-term insulin use (HCC)  Z79.4     5. Mixed hyperlipidemia  E78.2     6. Former smoker  Z87.891     82. PVC (premature ventricular contraction)  I49.3     8. Class 1 obesity due to excess calories with serious comorbidity and body mass  index (BMI) of 33.0 to 33.9 in adult  E66.09    Z68.33        RECOMMENDATIONS: Laura Holland is a 76 y.o. female whose past medical history and cardiac risk factors include: Insulin-dependent diabetes mellitus type 2, hypertension, hyperlipidemia, postmenopausal female, advanced age, former smoker, obesity.  Here for 1 year follow-up visit given her history of palpitations and PVC.  Symptoms are very well controlled with metoprolol succinate 25 mg p.o. daily.  Does not have symptoms of generalized tired, fatigue, near-syncope or syncopal events since last office encounter.  Overall doing  well from a cardiovascular standpoint.  Last echocardiogram and stress test reviewed as part of today's encounter.  Recommended either follow up as needed or annual given her age and risk factors. The shared decision was to follow-up on annual basis after her yearly annual visit or sooner if change in clinical status.   Orders Placed This Encounter  Procedures   EKG 12-Lead   --Continue cardiac medications as reconciled in final medication list. --No follow-ups on file. Or sooner if needed. --Continue follow-up with your primary care physician regarding the management of your other chronic comorbid conditions.  Patient's questions and concerns were addressed to her and her husband's satisfaction. She voices understanding of the instructions provided during this encounter.   This note was created using a voice recognition software as a result there may be grammatical errors inadvertently enclosed that do not reflect the nature of this encounter. Every attempt is made to correct such errors.  Total time spent: 20 minutes.  Rex Kras, [place], [facility]  Pager: [phone] Office: [phone]

## 2021-04-16 ENCOUNTER — Other Ambulatory Visit: Payer: Self-pay

## 2021-04-16 ENCOUNTER — Encounter (HOSPITAL_BASED_OUTPATIENT_CLINIC_OR_DEPARTMENT_OTHER): Payer: Self-pay | Admitting: Obstetrics and Gynecology

## 2021-04-23 ENCOUNTER — Encounter (HOSPITAL_BASED_OUTPATIENT_CLINIC_OR_DEPARTMENT_OTHER): Payer: Self-pay | Admitting: Obstetrics and Gynecology

## 2021-04-23 ENCOUNTER — Other Ambulatory Visit: Payer: Self-pay

## 2021-04-23 ENCOUNTER — Encounter (HOSPITAL_COMMUNITY)
Admission: RE | Admit: 2021-04-23 | Discharge: 2021-04-23 | Disposition: A | Discharge status: Home / Self Care | Payer: Medicare Other | Source: Ambulatory Visit | Attending: Obstetrics and Gynecology | Admitting: Obstetrics and Gynecology

## 2021-04-23 DIAGNOSIS — Z79899 Other long term (current) drug therapy: Secondary | ICD-10-CM | POA: Insufficient documentation

## 2021-04-23 DIAGNOSIS — Z01812 Encounter for preprocedural laboratory examination: Secondary | ICD-10-CM | POA: Insufficient documentation

## 2021-04-23 LAB — BASIC METABOLIC PANEL
Anion gap: 5 (ref 5–15)
BUN: 16 mg/dL (ref 8–23)
CO2: 29 mmol/L (ref 22–32)
Calcium: 9.4 mg/dL (ref 8.9–10.3)
Chloride: 107 mmol/L (ref 98–111)
Creatinine, Ser: 0.89 mg/dL (ref 0.44–1.00)
GFR, Estimated: >60 mL/min (ref >60)
Glucose, Bld: 224 mg/dL — ABNORMAL HIGH (ref 70–99)
Potassium: 4.3 mmol/L (ref 3.5–5.1)
Sodium: 141 mmol/L (ref 135–145)

## 2021-04-23 NOTE — Progress Notes (Signed)
Spoke w/ via phone for pre-op interview---pt Lab needs dos---- none    per anesthesia, surgery orders pending          Lab results------bmp 04-28-2021 epic, ekg 03-17-2021 chart/epic COVID test -----patient states asymptomatic no test needed Arrive at -------1215 pm 04-28-2021 NPO after MN NO Solid Food.  Clear liquids from MN until---1115 am Med rec completed Medications to take morning of surgery -----claritin, metoprolol succinate, synthroid, atorvastatin Diabetic medication -----take 1/2 dose of pm humalog 75/25 insulin night before surgery, no diabetic medications  day of surgery Patient instructed no nail polish to be worn day of surgery Patient instructed to bring photo id and insurance card day of surgery Patient aware to have Driver (ride ) / caregiver   Jeneen Rinks husband will stay  for 24 hours after surgery  Patient Special Instructions -----surgery orders req dr Landry Mellow epic ib Pre-Op special Istructions -----none Patient verbalized understanding of instructions that were given at this phone interview. Patient denies  any cardiac S & S, shortness of breath, chest pain, fever, cough at this phone interview.   [facility] cardiology s Terri Skains 03-17-2021 epic Echo 06-27-2019 ef 65 % Stress test 06-27-2019 epic low risk Holter monitor report 06-19-2019 epic   Medical history done on 04-16-2020 by [facility] RN

## 2021-04-27 ENCOUNTER — Other Ambulatory Visit: Payer: Self-pay | Admitting: Obstetrics and Gynecology

## 2021-04-27 NOTE — H&P (Deleted)
  The note originally documented on this encounter has been moved the the encounter in which it belongs.  
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
## 2021-04-28 ENCOUNTER — Ambulatory Visit (HOSPITAL_BASED_OUTPATIENT_CLINIC_OR_DEPARTMENT_OTHER)
Admission: RE | Admit: 2021-04-28 | Discharge: 2021-04-28 | Disposition: A | Discharge status: Home / Self Care | Payer: Medicare Other | Source: Ambulatory Visit | Attending: Obstetrics and Gynecology | Admitting: Obstetrics and Gynecology

## 2021-04-28 ENCOUNTER — Ambulatory Visit (HOSPITAL_BASED_OUTPATIENT_CLINIC_OR_DEPARTMENT_OTHER): Payer: Medicare Other | Admitting: Anesthesiology

## 2021-04-28 ENCOUNTER — Encounter (HOSPITAL_BASED_OUTPATIENT_CLINIC_OR_DEPARTMENT_OTHER)
Admission: RE | Disposition: A | Discharge status: Home / Self Care | Payer: Self-pay | Source: Ambulatory Visit | Attending: Obstetrics and Gynecology

## 2021-04-28 ENCOUNTER — Encounter (HOSPITAL_BASED_OUTPATIENT_CLINIC_OR_DEPARTMENT_OTHER): Payer: Self-pay | Admitting: Obstetrics and Gynecology

## 2021-04-28 DIAGNOSIS — N858 Other specified noninflammatory disorders of uterus: Secondary | ICD-10-CM | POA: Diagnosis not present

## 2021-04-28 DIAGNOSIS — I10 Essential (primary) hypertension: Secondary | ICD-10-CM | POA: Insufficient documentation

## 2021-04-28 DIAGNOSIS — E119 Type 2 diabetes mellitus without complications: Secondary | ICD-10-CM | POA: Diagnosis not present

## 2021-04-28 DIAGNOSIS — R9389 Abnormal findings on diagnostic imaging of other specified body structures: Secondary | ICD-10-CM

## 2021-04-28 DIAGNOSIS — N840 Polyp of corpus uteri: Secondary | ICD-10-CM | POA: Diagnosis not present

## 2021-04-28 DIAGNOSIS — Z79899 Other long term (current) drug therapy: Secondary | ICD-10-CM

## 2021-04-28 DIAGNOSIS — N950 Postmenopausal bleeding: Secondary | ICD-10-CM | POA: Insufficient documentation

## 2021-04-28 DIAGNOSIS — Z87891 Personal history of nicotine dependence: Secondary | ICD-10-CM | POA: Insufficient documentation

## 2021-04-28 DIAGNOSIS — E039 Hypothyroidism, unspecified: Secondary | ICD-10-CM | POA: Insufficient documentation

## 2021-04-28 HISTORY — DX: Ventricular premature depolarization: I49.3

## 2021-04-28 HISTORY — DX: Presence of dental prosthetic device (complete) (partial): Z97.2

## 2021-04-28 HISTORY — PX: DILATATION & CURETTAGE/HYSTEROSCOPY WITH MYOSURE: SHX6511

## 2021-04-28 HISTORY — DX: Presence of spectacles and contact lenses: Z97.3

## 2021-04-28 LAB — BASIC METABOLIC PANEL
Anion gap: 8 (ref 5–15)
BUN: 20 mg/dL (ref 8–23)
CO2: 26 mmol/L (ref 22–32)
Calcium: 9.3 mg/dL (ref 8.9–10.3)
Chloride: 104 mmol/L (ref 98–111)
Creatinine, Ser: 0.88 mg/dL (ref 0.44–1.00)
GFR, Estimated: >60 mL/min (ref >60)
Glucose, Bld: 204 mg/dL — ABNORMAL HIGH (ref 70–99)
Potassium: 4.3 mmol/L (ref 3.5–5.1)
Sodium: 138 mmol/L (ref 135–145)

## 2021-04-28 LAB — HEMOGLOBIN AND HEMATOCRIT, BLOOD
HCT: 37.1 % (ref 36.0–46.0)
Hemoglobin: 12.3 g/dL (ref 12.0–15.0)

## 2021-04-28 LAB — GLUCOSE, CAPILLARY: Glucose-Capillary: 178 mg/dL — ABNORMAL HIGH (ref 70–99)

## 2021-04-28 SURGERY — DILATATION & CURETTAGE/HYSTEROSCOPY WITH MYOSURE
Anesthesia: General | Site: Vagina

## 2021-04-28 MED ORDER — LACTATED RINGERS IV SOLN: INTRAVENOUS | Status: DC

## 2021-04-28 MED ORDER — FENTANYL CITRATE (PF) 100 MCG/2ML IJ SOLN
INTRAMUSCULAR | Status: AC
  Filled 2021-04-28: qty 2

## 2021-04-28 MED ORDER — LIDOCAINE 2% (20 MG/ML) 5 ML SYRINGE
INTRAMUSCULAR | Status: DC | PRN
  Administered 2021-04-28: 100 mg via INTRAVENOUS

## 2021-04-28 MED ORDER — ACETAMINOPHEN 500 MG PO TABS
1000.0000 mg | ORAL_TABLET | ORAL | Status: AC
  Administered 2021-04-28: 1000 mg via ORAL

## 2021-04-28 MED ORDER — ACETAMINOPHEN 500 MG PO TABS
ORAL_TABLET | ORAL | Status: AC
  Filled 2021-04-28: qty 2

## 2021-04-28 MED ORDER — ONDANSETRON HCL 4 MG/2ML IJ SOLN
INTRAMUSCULAR | Status: DC | PRN
  Administered 2021-04-28: 4 mg via INTRAVENOUS

## 2021-04-28 MED ORDER — PROPOFOL 10 MG/ML IV BOLUS
INTRAVENOUS | Status: AC
  Filled 2021-04-28: qty 20

## 2021-04-28 MED ORDER — ONDANSETRON HCL 4 MG/2ML IJ SOLN: 4.0000 mg | Freq: Once | INTRAMUSCULAR | Status: DC | PRN

## 2021-04-28 MED ORDER — LIDOCAINE HCL (PF) 2 % IJ SOLN
INTRAMUSCULAR | Status: AC
  Filled 2021-04-28: qty 5

## 2021-04-28 MED ORDER — OXYCODONE HCL 5 MG PO TABS: 5.0000 mg | ORAL_TABLET | Freq: Once | ORAL | Status: DC | PRN

## 2021-04-28 MED ORDER — POVIDONE-IODINE 10 % EX SWAB: 2.0000 "application " | Freq: Once | CUTANEOUS | Status: DC

## 2021-04-28 MED ORDER — BUPIVACAINE HCL (PF) 0.25 % IJ SOLN
INTRAMUSCULAR | Status: DC | PRN
  Administered 2021-04-28: 20 mL

## 2021-04-28 MED ORDER — PROPOFOL 10 MG/ML IV BOLUS
INTRAVENOUS | Status: DC | PRN
  Administered 2021-04-28: 120 mg via INTRAVENOUS

## 2021-04-28 MED ORDER — IBUPROFEN 800 MG PO TABS: 800.0000 mg | ORAL_TABLET | Freq: Three times a day (TID) | ORAL | 0 refills | Status: AC | PRN

## 2021-04-28 MED ORDER — FENTANYL CITRATE (PF) 100 MCG/2ML IJ SOLN
INTRAMUSCULAR | Status: DC | PRN
  Administered 2021-04-28: 50 ug via INTRAVENOUS

## 2021-04-28 MED ORDER — EPHEDRINE SULFATE-NACL 50-0.9 MG/10ML-% IV SOSY
PREFILLED_SYRINGE | INTRAVENOUS | Status: DC | PRN
Start: 2021-04-28 — End: 2021-04-28
  Administered 2021-04-28: 5 mg via INTRAVENOUS

## 2021-04-28 MED ORDER — DEXAMETHASONE SODIUM PHOSPHATE 10 MG/ML IJ SOLN
INTRAMUSCULAR | Status: DC | PRN
  Administered 2021-04-28: 5 mg via INTRAVENOUS

## 2021-04-28 MED ORDER — FENTANYL CITRATE (PF) 100 MCG/2ML IJ SOLN
25.0000 ug | INTRAMUSCULAR | Status: DC | PRN
  Administered 2021-04-28: 25 ug via INTRAVENOUS

## 2021-04-28 MED ORDER — GLYCOPYRROLATE 0.2 MG/ML IJ SOLN
INTRAMUSCULAR | Status: DC | PRN
  Administered 2021-04-28 (×2): .1 mg via INTRAVENOUS

## 2021-04-28 MED ORDER — SODIUM CHLORIDE 0.9 % IR SOLN
Status: DC | PRN
  Administered 2021-04-28: 3000 mL

## 2021-04-28 MED ORDER — ACETAMINOPHEN 325 MG PO TABS: 650.0000 mg | ORAL_TABLET | Freq: Four times a day (QID) | ORAL | Status: AC | PRN

## 2021-04-28 MED ORDER — OXYCODONE HCL 5 MG/5ML PO SOLN: 5.0000 mg | Freq: Once | ORAL | Status: DC | PRN

## 2021-04-28 SURGICAL SUPPLY — 18 items
CATH ROBINSON RED A/P 16FR (CATHETERS) IMPLANT
DEVICE MYOSURE LITE (MISCELLANEOUS) IMPLANT
DEVICE MYOSURE REACH (MISCELLANEOUS) ×1 IMPLANT
DILATOR CANAL MILEX (MISCELLANEOUS) IMPLANT
GAUZE 4X4 16PLY ~~LOC~~+RFID DBL (SPONGE) ×2 IMPLANT
GLOVE SURG ENC TEXT LTX SZ6.5 (GLOVE) ×2 IMPLANT
GLOVE SURG UNDER POLY LF SZ6.5 (GLOVE) ×2 IMPLANT
GLOVE SURG UNDER POLY LF SZ7 (GLOVE) ×4 IMPLANT
GLOVE SURG UNDER POLY LF SZ7.5 (GLOVE) ×1 IMPLANT
GOWN STRL REUS W/ TWL LRG LVL3 (GOWN DISPOSABLE) ×2 IMPLANT
GOWN STRL REUS W/TWL LRG LVL3 (GOWN DISPOSABLE) ×6
KIT PROCEDURE FLUENT (KITS) ×2 IMPLANT
PACK VAGINAL MINOR WOMEN LF (CUSTOM PROCEDURE TRAY) ×2 IMPLANT
PAD OB MATERNITY 4.3X12.25 (PERSONAL CARE ITEMS) ×2 IMPLANT
PAD PREP 24X48 CUFFED NSTRL (MISCELLANEOUS) ×2 IMPLANT
SEAL ROD LENS SCOPE MYOSURE (ABLATOR) ×2 IMPLANT
SLEEVE SCD COMPRESS KNEE MED (STOCKING) ×2 IMPLANT
TOWEL GREEN STERILE FF (TOWEL DISPOSABLE) ×2 IMPLANT

## 2021-04-28 NOTE — Op Note (Signed)
04/28/2021  2:09 PM  PATIENT:  Laura Holland  76 y.o. female  PRE-OPERATIVE DIAGNOSIS:  Postmenopausal Bleeding Endometrial Mass  Endometrial Thickening on Ultrasound  POST-OPERATIVE DIAGNOSIS:  Postmenopausal Bleeding Endometrial Mass  Endometrial Thickening on Ultrasound  PROCEDURE:  Procedure(s): DILATATION & CURETTAGE/HYSTEROSCOPY WITH MYOSURE (N/A)  SURGEON:  Surgeon(s) and Role:    Christophe Louis, MD - Primary  PHYSICIAN ASSISTANT:   ASSISTANTS: none   ANESTHESIA:   MAC  EBL:  5 CC   BLOOD ADMINISTERED:none  DRAINS: none   LOCAL MEDICATIONS USED:  MARCAINE     SPECIMEN:  Source of Specimen:  endocervical polyp / endometrial curettings and endometrial mass   DISPOSITION OF SPECIMEN:  PATHOLOGY  COUNTS:  YES  TOURNIQUET:  * No tourniquets in log *  DICTATION: .Note written in EPIC  PLAN OF CARE: Discharge to home after PACU  PATIENT DISPOSITION:  PACU - hemodynamically stable.   Delay start of Pharmacological VTE agent (>24hrs) due to surgical blood loss or risk of bleeding: not applicable  Findings: atrophic vaginal mucosa and cervix. 2 cm endocervical polyp. Endometrial polyp/mass... surrounding atrophic endometrium .   Procedure: Patient was taken to the operating room where she was placed under general anesthesia. She was placed in the dorsal lithotomy position. She was prepped and draped in the usual sterile fashion. A speculum was placed into the vaginal vault.  The endocervical polyp was grasped with polyp forceps and removed without difficulty. The anterior lip of the cervix was grasped with a single-tooth tenaculum. Quarter percent Marcaine was injected at the 4 and 8:00 positions of the cervix. Attempts to sound the uterus were unsuccessful due to cervical stenosis. The cervix was entered via hydro-dissection. . Mysosure operative  hysteroscope was inserted. The findings noted above. Myosure reach  blade was introduced throught the hysteroscope. The  endometrial mass was removed in 5 minute.  There was no evidence of perforation. Hysteroscope was then removed.  The single-tooth tenaculum was removed from the anterior lip of the cervix. The speculum was removed from the patient's vagina. She was awakened from anesthesia taken care  To the recovery  room awake and in stable condition. Sponge lap and needle counts were correct x2.

## 2021-04-28 NOTE — Anesthesia Postprocedure Evaluation (Signed)
Anesthesia Post Note  Patient: Laura Holland  Procedure(s) Performed: DILATATION & CURETTAGE/HYSTEROSCOPY WITH MYOSURE (Vagina )     Patient location during evaluation: PACU Anesthesia Type: General Level of consciousness: awake and alert Pain management: pain level controlled Vital Signs Assessment: post-procedure vital signs reviewed and stable Respiratory status: spontaneous breathing, nonlabored ventilation, respiratory function stable and patient connected to nasal cannula oxygen Cardiovascular status: blood pressure returned to baseline and stable Postop Assessment: no apparent nausea or vomiting Anesthetic complications: no   No notable events documented.  Last Vitals:  Vitals:   04/28/21 1240  BP: (!) 145/62  Pulse: 66  Resp: 15  Temp: (!) 36.2 C  SpO2: 96%    Last Pain:  Vitals:   04/28/21 1240  TempSrc: Oral  PainSc: 0-No pain                 Shifa Brisbon S

## 2021-04-28 NOTE — Transfer of Care (Signed)
Immediate Anesthesia Transfer of Care Note  Patient: Laura Holland  Procedure(s) Performed: DILATATION & CURETTAGE/HYSTEROSCOPY WITH MYOSURE (Vagina )  Patient Location: PACU  Anesthesia Type:General  Level of Consciousness: drowsy and patient cooperative  Airway & Oxygen Therapy: Patient Spontanous Breathing and Patient connected to face mask oxygen  Post-op Assessment: Report given to RN and Post -op Vital signs reviewed and stable  Post vital signs: Reviewed and stable  Last Vitals:  Vitals Value Taken Time  BP 143/70 04/28/21 1419  Temp    Pulse 73 04/28/21 1421  Resp 16 04/28/21 1421  SpO2 100 % 04/28/21 1421  Vitals shown include unvalidated device data.  Last Pain:  Vitals:   04/28/21 1240  TempSrc: Oral  PainSc: 0-No pain      Patients Stated Pain Goal: 5 (32/95/18 8416)  Complications: No notable events documented.

## 2021-04-28 NOTE — Anesthesia Preprocedure Evaluation (Signed)
Anesthesia Evaluation  Patient identified by MRN, date of birth, ID band Patient awake    Reviewed: Allergy & Precautions, NPO status , Patient's Chart, lab work & pertinent test results  History of Anesthesia Complications (+) PONV and history of anesthetic complications  Airway Mallampati: II  TM Distance: >3 FB Neck ROM: Full    Dental no notable dental hx.    Pulmonary neg pulmonary ROS, former smoker,    Pulmonary exam normal breath sounds clear to auscultation       Cardiovascular hypertension, Normal cardiovascular exam Rhythm:Regular Rate:Normal     Neuro/Psych negative neurological ROS  negative psych ROS   GI/Hepatic negative GI ROS, Neg liver ROS,   Endo/Other  diabetes, Type 2Hypothyroidism   Renal/GU negative Renal ROS  negative genitourinary   Musculoskeletal negative musculoskeletal ROS (+)   Abdominal   Peds negative pediatric ROS (+)  Hematology negative hematology ROS (+)   Anesthesia Other Findings   Reproductive/Obstetrics negative OB ROS                             Anesthesia Physical Anesthesia Plan  ASA: 2  Anesthesia Plan: General   Post-op Pain Management: Minimal or no pain anticipated   Induction: Intravenous  PONV Risk Score and Plan: 4 or greater and Ondansetron, Dexamethasone and Treatment may vary due to age or medical condition  Airway Management Planned: LMA  Additional Equipment:   Intra-op Plan:   Post-operative Plan: Extubation in OR  Informed Consent: I have reviewed the patients History and Physical, chart, labs and discussed the procedure including the risks, benefits and alternatives for the proposed anesthesia with the patient or authorized representative who has indicated his/her understanding and acceptance.     Dental advisory given  Plan Discussed with: CRNA and Surgeon  Anesthesia Plan Comments:         Anesthesia  Quick Evaluation

## 2021-04-28 NOTE — Anesthesia Procedure Notes (Signed)
Procedure Name: LMA Insertion Date/Time: 04/28/2021 1:29 PM Performed by: Gwyndolyn Saxon, CRNA Pre-anesthesia Checklist: Patient identified, Emergency Drugs available, Suction available and Patient being monitored Patient Re-evaluated:Patient Re-evaluated prior to induction Oxygen Delivery Method: Circle system utilized Preoxygenation: Pre-oxygenation with 100% oxygen Induction Type: IV induction Ventilation: Mask ventilation without difficulty LMA: LMA inserted LMA Size: 4.0 Placement Confirmation: positive ETCO2 and breath sounds checked- equal and bilateral Tube secured with: Tape Dental Injury: Teeth and Oropharynx as per pre-operative assessment

## 2021-04-28 NOTE — Discharge Instructions (Signed)

## 2021-04-28 NOTE — H&P (Signed)
Date of Initial H&P: 04/27/2021  History reviewed, patient examined, no change in status, stable for surgery.

## 2021-04-29 ENCOUNTER — Encounter (HOSPITAL_BASED_OUTPATIENT_CLINIC_OR_DEPARTMENT_OTHER): Payer: Self-pay | Admitting: Obstetrics and Gynecology

## 2021-04-30 LAB — SURGICAL PATHOLOGY

## 2021-07-21 ENCOUNTER — Other Ambulatory Visit: Payer: Self-pay | Admitting: Surgery

## 2021-08-26 ENCOUNTER — Encounter (HOSPITAL_COMMUNITY): Payer: Self-pay | Admitting: Surgery

## 2021-08-26 NOTE — Progress Notes (Signed)
PCP -   Carol Ada, MD   Cardiologist - Rex Kras, DO EKG - 03/17/21  Chest x-ray -  ECHO - 06/27/19 Cardiac Cath -  CPAP -  Fasting Blood Sugar:  160s Checks Blood Sugar:  3-4x/day  ERAS Protcol - 1130 COVID TEST- n/a  Anesthesia review: n/a  -------------  SDW INSTRUCTIONS:  Your procedure is scheduled on 08/28/21. Please report to [facility] Main Entrance "A" at 1200 P.M., and check in at the Admitting office. Call this number if you have problems the morning of surgery: [phone]   Remember: Do not eat after midnight the night before your surgery  You may drink clear liquids until 1130AM the morning of your surgery.   Clear liquids allowed are: Water, Non-Citrus Juices (without pulp), Carbonated Beverages, Clear Tea, Black Coffee Only, and Gatorade   Medications to take morning of surgery with a sip of water include: acetaminophen (TYLENOL) if needed fluticasone (FLONASE) if needed atorvastatin (LIPITOR)  cetirizine (ZYRTEC) levothyroxine (SYNTHROID)  metoprolol succinate (TOPROL-XL)   ** PLEASE check your blood sugar the morning of your surgery when you wake up and every 2 hours until you get to the Short Stay unit.  If your blood sugar is less than 70 mg/dL, you will need to treat for low blood sugar: Do not take insulin. Treat a low blood sugar (less than 70 mg/dL) with  cup of clear juice (cranberry or apple), 4 glucose tablets, OR glucose gel. Recheck blood sugar in 15 minutes after treatment (to make sure it is greater than 70 mg/dL). If your blood sugar is not greater than 70 mg/dL on recheck, call [phone] for further instructions.  HUMALOG 75/25  6/22 AM 25units; PM 10units 6/23 none  metFORMIN (GLUCOPHAGE)  6/22 usual 6/23 none  RYBELSUS  6/22 usual 6/23 none   As of today, STOP taking any Aspirin (unless otherwise instructed by your surgeon), Aleve, Naproxen, Ibuprofen, Motrin, Advil, Goody's, BC's, all herbal medications, fish oil,  and all vitamins.    The Morning of Surgery Do not wear jewelry, make-up or nail polish. Do not wear lotions, powders, or perfumes/colognes, or deodorant Do not bring valuables to the hospital. [facility] is not responsible for any belongings or valuables.  If you are a smoker, DO NOT Smoke 24 hours prior to surgery  If you wear a CPAP at night please bring your mask the morning of surgery   Remember that you must have someone to transport you home after your surgery, and remain with you for 24 hours if you are discharged the same day.  Please bring cases for contacts, glasses, hearing aids, dentures or bridgework because it cannot be worn into surgery.   Patients discharged the day of surgery will not be allowed to drive home.   Please shower the NIGHT BEFORE/MORNING OF SURGERY (use antibacterial soap like DIAL soap if possible). Wear comfortable clothes the morning of surgery. Oral Hygiene is also important to reduce your risk of infection.  Remember - BRUSH YOUR TEETH THE MORNING OF SURGERY WITH YOUR REGULAR TOOTHPASTE  Patient denies shortness of breath, fever, cough and chest pain.

## 2021-08-27 NOTE — H&P (Addendum)
REFERRING PHYSICIAN: Reginia Naas, *  PROVIDER: Beverlee Nims, MD  MRN: [id number] DOB: Jul 21, 1945  Subjective   Chief Complaint: New Consultation (Hernia )   History of Present Illness: Laura Holland is a 76 y.o. female who is seen  as an office consultation for evaluation of New Consultation (Hernia ) .   This is a pleasant 76 year old female referred to me for evaluation of a possible ventral hernia. 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 She had a lot of coughing after that surgery. She then had a D&C in February of this year to rule out endometrial cancer which was benign but again had coughing. She then developed COVID in March and had more coughing and started noticing a bulge in her upper abdomen just above her previous midline incision. She reports pain at the area but no obstructive symptoms. She does report some fullness occasionally. The pain is mild to moderate. She is having normal bowel movements. She denies fevers or chills. She has no cardiopulmonary issues currently  Review of Systems: A complete review of systems was obtained from the patient. I have reviewed this information and discussed as appropriate with the patient. See HPI as well for other ROS.  ROS   Medical History: Past Medical History:  Diagnosis Date   Arrhythmia   Arthritis   Diabetes mellitus without complication (CMS-HCC)   Glaucoma (increased eye pressure)   Hyperlipidemia   Hypertension   Thyroid disease   Patient Active Problem List  Diagnosis   Allergic rhinitis   Acute upper respiratory infection   Atrophy of vagina   Binocular vision disorder with diplopia   Combined form of senile cataract   Combined forms of age-related cataract of both eyes   COVID-19   Diabetes mellitus without complication (CMS-HCC)   Endometrial thickening on ultrasound   Essential hypertension   Exotropia of both eyes   Hyperglycemia due to type  2 diabetes mellitus (CMS-HCC)   Hypothyroidism   Iron deficiency anemia   Irregular heart beat   Localized edema   Malignant tumor of kidney (CMS-HCC)   Mild major depression, single episode (CMS-HCC)   Monocular exotropia   Multiple premature ventricular complexes   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   Other specified conditions associated with female genital organs and menstrual cycle   Postmenopausal bleeding   Primary insomnia   Pure hypercholesterolemia   Right carpal tunnel syndrome   Sagittal band rupture, extensor tendon, nontraumatic, right   Screening for malignant neoplasm of colon   Tinnitus   Past Surgical History:  Procedure Laterality Date   CESAREAN SECTION 1981   Kidney Surgery Left 01/2021   CATARACT EXTRACTION   CHOLECYSTECTOMY    Allergies  Allergen Reactions   Canagliflozin Other (See Comments)  Other reaction(s): yeast   Epinephrine Other (See Comments) and Palpitations  Heart races   Penicillins Rash   Procaine Palpitations   Current Outpatient Medications on File Prior to Visit  Medication Sig Dispense Refill   FUROsemide (LASIX) 20 MG tablet Take by mouth   metoprolol succinate (TOPROL-XL) 25 MG XL tablet Take 1 tablet by mouth once daily   semaglutide (RYBELSUS) 7 mg tablet 1 tablet at least 30 minutes before first food, beverage or other oral medicine of the day   atorvastatin (LIPITOR) 40 MG tablet Take 40 mg by mouth every morning   calcium carbonate-vitamin D3 (CALTRATE 600+D) 600 mg-10 mcg (400 unit) Chew chewable tablet Take  by mouth   fexofenadine-pseudoephedrine (ALLEGRA-D 24H) 180-240 mg ER tablet Take 1 tablet by mouth once daily   HUMALOG MIX 75-25 KWIKPEN 100 unit/mL (75-25) pen injector   levothyroxine (SYNTHROID) 100 MCG tablet Take by mouth   metFORMIN (GLUCOPHAGE) 1000 MG tablet Take 1 tablet by mouth 2 (two) times daily with meals   No current facility-administered medications on file prior to visit.   History  reviewed. No pertinent family history.   Social History   Tobacco Use  Smoking Status Never  Smokeless Tobacco Never    Social History   Socioeconomic History   Marital status: Married  Tobacco Use   Smoking status: Never   Smokeless tobacco: Never  Substance and Sexual Activity   Alcohol use: Not Currently   Drug use: Never   Objective:   Vitals:   BP: (!) 140/68  Pulse: 81  Temp: 36.2 C (97.1 F)  SpO2: 96%  Weight: 91.6 kg (202 lb)  Height: 162.6 cm ('5\' 4"'$ )   Body mass index is 34.67 kg/m.  Physical Exam   She appears well on exam  Her abdomen is soft and obese. There is an easily reducible incisional hernia just above the skin incision from the robotic port site. It is above the umbilicus in the upper abdomen. The fascial defect is approximately 3 cm in size. There are no other obvious hernias.  There is no hepatomegaly.  Labs, Imaging and Diagnostic Testing: I have reviewed her notes in the electronic medical records  Assessment and Plan:   Diagnoses and all orders for this visit:  Incisional hernia, without obstruction or gangrene    At this point I discussion with the patient and her husband regarding abdominal wall anatomy and hernias. We discussed the different type of hernias and the reason to proceed with hernia repair. As she is symptomatic and the hernia is getting larger I recommended repair. We next discussed both the open and laparoscopic techniques as well as use of mesh. As this hernia is fairly small, I would recommend an open repair with mesh as an outpatient. I discussed the reason for this with her. I explained the surgical procedure in detail. I discussed the risks which includes but is not limited to bleeding, infection, injury to surrounding structures, the use of mesh, hernia recurrence, cardiopulmonary issues, postoperative recovery, etc. They understand and wish to proceed with surgery which will be scheduled.

## 2021-08-28 ENCOUNTER — Ambulatory Visit (HOSPITAL_BASED_OUTPATIENT_CLINIC_OR_DEPARTMENT_OTHER): Payer: Medicare Other | Admitting: Anesthesiology

## 2021-08-28 ENCOUNTER — Encounter (HOSPITAL_COMMUNITY)
Admission: RE | Disposition: A | Discharge status: Home / Self Care | Payer: Self-pay | Source: Ambulatory Visit | Attending: Surgery

## 2021-08-28 ENCOUNTER — Other Ambulatory Visit: Payer: Self-pay

## 2021-08-28 ENCOUNTER — Ambulatory Visit (HOSPITAL_COMMUNITY)
Admission: RE | Admit: 2021-08-28 | Discharge: 2021-08-28 | Disposition: A | Discharge status: Home / Self Care | Payer: Medicare Other | Source: Ambulatory Visit | Attending: Surgery | Admitting: Surgery

## 2021-08-28 ENCOUNTER — Ambulatory Visit (HOSPITAL_COMMUNITY): Payer: Medicare Other | Admitting: Anesthesiology

## 2021-08-28 ENCOUNTER — Encounter (HOSPITAL_COMMUNITY): Payer: Self-pay | Admitting: Surgery

## 2021-08-28 DIAGNOSIS — E119 Type 2 diabetes mellitus without complications: Secondary | ICD-10-CM | POA: Insufficient documentation

## 2021-08-28 DIAGNOSIS — Z905 Acquired absence of kidney: Secondary | ICD-10-CM | POA: Insufficient documentation

## 2021-08-28 DIAGNOSIS — Z7984 Long term (current) use of oral hypoglycemic drugs: Secondary | ICD-10-CM | POA: Insufficient documentation

## 2021-08-28 DIAGNOSIS — M1990 Unspecified osteoarthritis, unspecified site: Secondary | ICD-10-CM | POA: Insufficient documentation

## 2021-08-28 DIAGNOSIS — I10 Essential (primary) hypertension: Secondary | ICD-10-CM | POA: Diagnosis not present

## 2021-08-28 DIAGNOSIS — Z87891 Personal history of nicotine dependence: Secondary | ICD-10-CM | POA: Insufficient documentation

## 2021-08-28 DIAGNOSIS — Z85528 Personal history of other malignant neoplasm of kidney: Secondary | ICD-10-CM | POA: Diagnosis not present

## 2021-08-28 DIAGNOSIS — Z8616 Personal history of COVID-19: Secondary | ICD-10-CM | POA: Diagnosis not present

## 2021-08-28 DIAGNOSIS — K432 Incisional hernia without obstruction or gangrene: Secondary | ICD-10-CM

## 2021-08-28 DIAGNOSIS — E039 Hypothyroidism, unspecified: Secondary | ICD-10-CM | POA: Insufficient documentation

## 2021-08-28 DIAGNOSIS — Z79899 Other long term (current) drug therapy: Secondary | ICD-10-CM | POA: Diagnosis not present

## 2021-08-28 DIAGNOSIS — Z794 Long term (current) use of insulin: Secondary | ICD-10-CM | POA: Diagnosis not present

## 2021-08-28 HISTORY — PX: INCISIONAL HERNIA REPAIR: SHX193

## 2021-08-28 LAB — BASIC METABOLIC PANEL
Anion gap: 10 (ref 5–15)
BUN: 13 mg/dL (ref 8–23)
CO2: 25 mmol/L (ref 22–32)
Calcium: 8.9 mg/dL (ref 8.9–10.3)
Chloride: 107 mmol/L (ref 98–111)
Creatinine, Ser: 1.03 mg/dL — ABNORMAL HIGH (ref 0.44–1.00)
GFR, Estimated: 57 mL/min — ABNORMAL LOW (ref >60)
Glucose, Bld: 164 mg/dL — ABNORMAL HIGH (ref 70–99)
Potassium: 3.5 mmol/L (ref 3.5–5.1)
Sodium: 142 mmol/L (ref 135–145)

## 2021-08-28 LAB — GLUCOSE, CAPILLARY
Glucose-Capillary: 164 mg/dL — ABNORMAL HIGH (ref 70–99)
Glucose-Capillary: 163 mg/dL — ABNORMAL HIGH (ref 70–99)

## 2021-08-28 LAB — CBC
HCT: 33.9 % — ABNORMAL LOW (ref 36.0–46.0)
Hemoglobin: 11.5 g/dL — ABNORMAL LOW (ref 12.0–15.0)
MCH: 31.1 pg (ref 26.0–34.0)
MCHC: 33.9 g/dL (ref 30.0–36.0)
MCV: 91.6 fL (ref 80.0–100.0)
Platelets: 178 10*3/uL (ref 150–400)
RBC: 3.7 MIL/uL — ABNORMAL LOW (ref 3.87–5.11)
RDW: 13.2 % (ref 11.5–15.5)
WBC: 4.9 10*3/uL (ref 4.0–10.5)
nRBC: 0 % (ref 0.0–0.2)

## 2021-08-28 SURGERY — REPAIR, HERNIA, INCISIONAL
Anesthesia: General | Site: Abdomen

## 2021-08-28 MED ORDER — INSULIN ASPART 100 UNIT/ML IJ SOLN: 0.0000 [IU] | INTRAMUSCULAR | Status: DC | PRN

## 2021-08-28 MED ORDER — METOPROLOL SUCCINATE ER 25 MG PO TB24
ORAL_TABLET | ORAL | Status: AC
  Filled 2021-08-28: qty 1

## 2021-08-28 MED ORDER — ONDANSETRON HCL 4 MG/2ML IJ SOLN
INTRAMUSCULAR | Status: DC | PRN
  Administered 2021-08-28: 4 mg via INTRAVENOUS

## 2021-08-28 MED ORDER — LACTATED RINGERS IV SOLN: INTRAVENOUS | Status: DC

## 2021-08-28 MED ORDER — CHLORHEXIDINE GLUCONATE CLOTH 2 % EX PADS: 6.0000 | MEDICATED_PAD | Freq: Once | CUTANEOUS | Status: DC

## 2021-08-28 MED ORDER — PROPOFOL 10 MG/ML IV BOLUS
INTRAVENOUS | Status: AC
Start: 2021-08-28 — End: ?
  Filled 2021-08-28: qty 20

## 2021-08-28 MED ORDER — CIPROFLOXACIN IN D5W 400 MG/200ML IV SOLN
400.0000 mg | INTRAVENOUS | Status: AC
  Administered 2021-08-28: 400 mg via INTRAVENOUS
  Filled 2021-08-28: qty 200

## 2021-08-28 MED ORDER — ORAL CARE MOUTH RINSE: 15.0000 mL | Freq: Once | OROMUCOSAL | Status: AC

## 2021-08-28 MED ORDER — NEOSTIGMINE METHYLSULFATE 3 MG/3ML IV SOSY
PREFILLED_SYRINGE | INTRAVENOUS | Status: AC
Start: 2021-08-28 — End: ?
  Filled 2021-08-28: qty 3

## 2021-08-28 MED ORDER — AMISULPRIDE (ANTIEMETIC) 5 MG/2ML IV SOLN
10.0000 mg | Freq: Once | INTRAVENOUS | Status: DC | PRN
Start: 2021-08-28 — End: 2021-08-28

## 2021-08-28 MED ORDER — ONDANSETRON HCL 4 MG/2ML IJ SOLN: 4.0000 mg | Freq: Once | INTRAMUSCULAR | Status: DC | PRN

## 2021-08-28 MED ORDER — TRAMADOL HCL 50 MG PO TABS: 50.0000 mg | ORAL_TABLET | Freq: Four times a day (QID) | ORAL | 0 refills | Status: AC | PRN

## 2021-08-28 MED ORDER — ONDANSETRON HCL 4 MG/2ML IJ SOLN
INTRAMUSCULAR | Status: AC
Start: 2021-08-28 — End: ?
  Filled 2021-08-28: qty 4

## 2021-08-28 MED ORDER — ENSURE PRE-SURGERY PO LIQD: 296.0000 mL | Freq: Once | ORAL | Status: DC

## 2021-08-28 MED ORDER — PHENYLEPHRINE 80 MCG/ML (10ML) SYRINGE FOR IV PUSH (FOR BLOOD PRESSURE SUPPORT)
PREFILLED_SYRINGE | INTRAVENOUS | Status: AC
  Filled 2021-08-28: qty 30

## 2021-08-28 MED ORDER — TRAMADOL HCL 50 MG PO TABS
ORAL_TABLET | ORAL | Status: AC
  Filled 2021-08-28: qty 1

## 2021-08-28 MED ORDER — DEXAMETHASONE SODIUM PHOSPHATE 10 MG/ML IJ SOLN
INTRAMUSCULAR | Status: AC
Start: 2021-08-28 — End: ?
  Filled 2021-08-28: qty 1

## 2021-08-28 MED ORDER — FENTANYL CITRATE (PF) 100 MCG/2ML IJ SOLN: 25.0000 ug | INTRAMUSCULAR | Status: DC | PRN

## 2021-08-28 MED ORDER — EPHEDRINE SULFATE (PRESSORS) 50 MG/ML IJ SOLN
INTRAMUSCULAR | Status: DC | PRN
  Administered 2021-08-28: 5 mg via INTRAVENOUS

## 2021-08-28 MED ORDER — PROPOFOL 10 MG/ML IV BOLUS
INTRAVENOUS | Status: DC | PRN
  Administered 2021-08-28: 150 mg via INTRAVENOUS

## 2021-08-28 MED ORDER — ACETAMINOPHEN 500 MG PO TABS
1000.0000 mg | ORAL_TABLET | ORAL | Status: AC
  Administered 2021-08-28: 1000 mg via ORAL
  Filled 2021-08-28: qty 2

## 2021-08-28 MED ORDER — FENTANYL CITRATE (PF) 250 MCG/5ML IJ SOLN
INTRAMUSCULAR | Status: DC | PRN
  Administered 2021-08-28: 50 ug via INTRAVENOUS

## 2021-08-28 MED ORDER — ROCURONIUM BROMIDE 10 MG/ML (PF) SYRINGE
PREFILLED_SYRINGE | INTRAVENOUS | Status: AC
  Filled 2021-08-28: qty 20

## 2021-08-28 MED ORDER — GLYCOPYRROLATE PF 0.2 MG/ML IJ SOSY
PREFILLED_SYRINGE | INTRAMUSCULAR | Status: AC
  Filled 2021-08-28: qty 3

## 2021-08-28 MED ORDER — FENTANYL CITRATE (PF) 250 MCG/5ML IJ SOLN
INTRAMUSCULAR | Status: AC
  Filled 2021-08-28: qty 5

## 2021-08-28 MED ORDER — TRAMADOL HCL 50 MG PO TABS
50.0000 mg | ORAL_TABLET | Freq: Once | ORAL | Status: AC
  Administered 2021-08-28: 50 mg via ORAL

## 2021-08-28 MED ORDER — EPHEDRINE 5 MG/ML INJ
INTRAVENOUS | Status: AC
  Filled 2021-08-28: qty 15

## 2021-08-28 MED ORDER — KETOROLAC TROMETHAMINE 15 MG/ML IJ SOLN
INTRAMUSCULAR | Status: AC
  Filled 2021-08-28: qty 1

## 2021-08-28 MED ORDER — KETOROLAC TROMETHAMINE 15 MG/ML IJ SOLN
15.0000 mg | Freq: Once | INTRAMUSCULAR | Status: AC | PRN
Start: 2021-08-28 — End: 2021-08-28
  Administered 2021-08-28: 15 mg via INTRAVENOUS

## 2021-08-28 MED ORDER — 0.9 % SODIUM CHLORIDE (POUR BTL) OPTIME
TOPICAL | Status: DC | PRN
  Administered 2021-08-28: 1000 mL

## 2021-08-28 MED ORDER — PHENYLEPHRINE HCL-NACL 20-0.9 MG/250ML-% IV SOLN
INTRAVENOUS | Status: DC | PRN
  Administered 2021-08-28: 20 ug/min via INTRAVENOUS

## 2021-08-28 MED ORDER — LIDOCAINE 2% (20 MG/ML) 5 ML SYRINGE
INTRAMUSCULAR | Status: DC | PRN
  Administered 2021-08-28: 60 mg via INTRAVENOUS

## 2021-08-28 MED ORDER — LIDOCAINE 2% (20 MG/ML) 5 ML SYRINGE
INTRAMUSCULAR | Status: AC
  Filled 2021-08-28: qty 5

## 2021-08-28 MED ORDER — METOPROLOL SUCCINATE ER 25 MG PO TB24
25.0000 mg | ORAL_TABLET | Freq: Once | ORAL | Status: AC
Start: 2021-08-28 — End: 2021-08-28
  Administered 2021-08-28: 25 mg via ORAL

## 2021-08-28 MED ORDER — CHLORHEXIDINE GLUCONATE 0.12 % MT SOLN
15.0000 mL | Freq: Once | OROMUCOSAL | Status: AC
  Administered 2021-08-28: 15 mL via OROMUCOSAL
  Filled 2021-08-28: qty 15

## 2021-08-28 MED ORDER — BUPIVACAINE-EPINEPHRINE 0.25% -1:200000 IJ SOLN
INTRAMUSCULAR | Status: DC | PRN
  Administered 2021-08-28: 20 mL

## 2021-08-28 MED ORDER — BUPIVACAINE-EPINEPHRINE (PF) 0.25% -1:200000 IJ SOLN
INTRAMUSCULAR | Status: AC
  Filled 2021-08-28: qty 30

## 2021-08-28 SURGICAL SUPPLY — 36 items
BAG COUNTER SPONGE SURGICOUNT (BAG) ×2 IMPLANT
BLADE CLIPPER SURG (BLADE) IMPLANT
CANISTER SUCT 3000ML PPV (MISCELLANEOUS) ×2 IMPLANT
CHLORAPREP W/TINT 26 (MISCELLANEOUS) ×2 IMPLANT
COVER SURGICAL LIGHT HANDLE (MISCELLANEOUS) ×2 IMPLANT
DERMABOND ADVANCED (GAUZE/BANDAGES/DRESSINGS) ×1
DERMABOND ADVANCED .7 DNX12 (GAUZE/BANDAGES/DRESSINGS) IMPLANT
DRAPE LAPAROSCOPIC ABDOMINAL (DRAPES) ×2 IMPLANT
DRSG TEGADERM 4X4.75 (GAUZE/BANDAGES/DRESSINGS) IMPLANT
ELECT REM PT RETURN 9FT ADLT (ELECTROSURGICAL) ×2
ELECTRODE REM PT RTRN 9FT ADLT (ELECTROSURGICAL) ×1 IMPLANT
GAUZE SPONGE 4X4 12PLY STRL (GAUZE/BANDAGES/DRESSINGS) IMPLANT
GLOVE SURG SIGNA 7.5 PF LTX (GLOVE) ×2 IMPLANT
GOWN STRL REUS W/ TWL LRG LVL3 (GOWN DISPOSABLE) ×1 IMPLANT
GOWN STRL REUS W/ TWL XL LVL3 (GOWN DISPOSABLE) ×1 IMPLANT
GOWN STRL REUS W/TWL LRG LVL3 (GOWN DISPOSABLE) ×2
GOWN STRL REUS W/TWL XL LVL3 (GOWN DISPOSABLE) ×2
KIT BASIN OR (CUSTOM PROCEDURE TRAY) ×2 IMPLANT
KIT TURNOVER KIT B (KITS) ×2 IMPLANT
MESH VENTRALEX ST 2.5 CRC MED (Mesh General) ×1 IMPLANT
NDL HYPO 25GX1X1/2 BEV (NEEDLE) ×1 IMPLANT
NEEDLE HYPO 25GX1X1/2 BEV (NEEDLE) ×2 IMPLANT
NS IRRIG 1000ML POUR BTL (IV SOLUTION) ×2 IMPLANT
PACK GENERAL/GYN (CUSTOM PROCEDURE TRAY) ×2 IMPLANT
PAD ARMBOARD 7.5X6 YLW CONV (MISCELLANEOUS) ×2 IMPLANT
PENCIL SMOKE EVACUATOR (MISCELLANEOUS) ×2 IMPLANT
STAPLER VISISTAT 35W (STAPLE) IMPLANT
SUT MNCRL AB 4-0 PS2 18 (SUTURE) IMPLANT
SUT NOVA NAB DX-16 0-1 5-0 T12 (SUTURE) IMPLANT
SUT PROLENE 1 CT (SUTURE) IMPLANT
SUT VIC AB 3-0 SH 27 (SUTURE)
SUT VIC AB 3-0 SH 27XBRD (SUTURE) IMPLANT
SYR CONTROL 10ML LL (SYRINGE) IMPLANT
TOWEL GREEN STERILE (TOWEL DISPOSABLE) ×2 IMPLANT
TOWEL GREEN STERILE FF (TOWEL DISPOSABLE) ×2 IMPLANT
TRAY FOLEY MTR SLVR 14FR STAT (SET/KITS/TRAYS/PACK) IMPLANT

## 2021-08-29 ENCOUNTER — Encounter (HOSPITAL_COMMUNITY): Payer: Self-pay | Admitting: Surgery

## 2022-04-12 ENCOUNTER — Ambulatory Visit (INDEPENDENT_AMBULATORY_CARE_PROVIDER_SITE_OTHER): Payer: Medicare Other | Admitting: Podiatry

## 2022-04-12 ENCOUNTER — Encounter: Payer: Self-pay | Admitting: Podiatry

## 2022-04-12 DIAGNOSIS — M79675 Pain in left toe(s): Secondary | ICD-10-CM | POA: Diagnosis not present

## 2022-04-12 DIAGNOSIS — B351 Tinea unguium: Secondary | ICD-10-CM | POA: Diagnosis not present

## 2022-04-12 DIAGNOSIS — E1149 Type 2 diabetes mellitus with other diabetic neurological complication: Secondary | ICD-10-CM | POA: Diagnosis not present

## 2022-04-12 DIAGNOSIS — M79674 Pain in right toe(s): Secondary | ICD-10-CM

## 2022-04-12 DIAGNOSIS — E1140 Type 2 diabetes mellitus with diabetic neuropathy, unspecified: Secondary | ICD-10-CM

## 2022-04-13 NOTE — Progress Notes (Signed)
Subjective:   Patient ID: Laura Holland, female   DOB: 77 y.o.   MRN: [id number]   HPI Patient presents with a significant thickening of the right big toenail and other nails that are bothersome for her and states that she thinks also it may be related to her diabetes.  Patient tries to keep it under good control does not smoke likes to be active   Review of Systems  All other systems reviewed and are negative.       Objective:  Physical Exam Vitals and nursing note reviewed.  Constitutional:      Appearance: She is well-developed.  Pulmonary:     Effort: Pulmonary effort is normal.  Musculoskeletal:        General: Normal range of motion.  Skin:    General: Skin is warm.  Neurological:     Mental Status: She is alert.     Neurovascular status indicated pulses intact mild diminishment sharp dull vibratory bilateral with patient found to have thickened nailbeds 1-5 both feet with the right hallux nail being dystrophic very thickened incurvated and moderately painful with all nails giving her problems to a degree.  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  Education rendered and patient will be seen back on an as-needed basis

## 2022-05-27 ENCOUNTER — Other Ambulatory Visit: Payer: Self-pay

## 2022-05-27 DIAGNOSIS — Z139 Encounter for screening, unspecified: Secondary | ICD-10-CM

## 2022-11-24 ENCOUNTER — Other Ambulatory Visit (HOSPITAL_BASED_OUTPATIENT_CLINIC_OR_DEPARTMENT_OTHER): Payer: Self-pay

## 2022-11-24 MED ORDER — MOUNJARO 10 MG/0.5ML ~~LOC~~ SOAJ
10.0000 mg | SUBCUTANEOUS | 3 refills | Status: DC
  Filled 2022-11-24: qty 2, 28d supply, fill #0
  Filled 2022-12-27: qty 2, 28d supply, fill #1
  Filled 2023-01-17: qty 2, 28d supply, fill #2
  Filled 2023-02-16: qty 2, 28d supply, fill #3

## 2022-11-26 ENCOUNTER — Other Ambulatory Visit (HOSPITAL_BASED_OUTPATIENT_CLINIC_OR_DEPARTMENT_OTHER): Payer: Self-pay

## 2022-12-27 ENCOUNTER — Other Ambulatory Visit (HOSPITAL_BASED_OUTPATIENT_CLINIC_OR_DEPARTMENT_OTHER): Payer: Self-pay

## 2023-02-16 ENCOUNTER — Other Ambulatory Visit (HOSPITAL_BASED_OUTPATIENT_CLINIC_OR_DEPARTMENT_OTHER): Payer: Self-pay

## 2023-03-21 ENCOUNTER — Other Ambulatory Visit (HOSPITAL_BASED_OUTPATIENT_CLINIC_OR_DEPARTMENT_OTHER): Payer: Self-pay

## 2023-03-22 ENCOUNTER — Other Ambulatory Visit (HOSPITAL_BASED_OUTPATIENT_CLINIC_OR_DEPARTMENT_OTHER): Payer: Self-pay

## 2023-03-22 MED ORDER — MOUNJARO 10 MG/0.5ML ~~LOC~~ SOAJ
10.0000 mg | SUBCUTANEOUS | 3 refills | Status: DC
  Filled 2023-03-22: qty 2, 28d supply, fill #0
  Filled 2023-04-25: qty 2, 28d supply, fill #1
  Filled 2023-05-19: qty 2, 28d supply, fill #2
  Filled 2023-06-21: qty 2, 28d supply, fill #3

## 2023-04-25 ENCOUNTER — Other Ambulatory Visit (HOSPITAL_BASED_OUTPATIENT_CLINIC_OR_DEPARTMENT_OTHER): Payer: Self-pay

## 2023-04-26 ENCOUNTER — Other Ambulatory Visit (HOSPITAL_BASED_OUTPATIENT_CLINIC_OR_DEPARTMENT_OTHER): Payer: Self-pay

## 2023-05-19 ENCOUNTER — Other Ambulatory Visit (HOSPITAL_BASED_OUTPATIENT_CLINIC_OR_DEPARTMENT_OTHER): Payer: Self-pay

## 2023-06-21 ENCOUNTER — Other Ambulatory Visit (HOSPITAL_BASED_OUTPATIENT_CLINIC_OR_DEPARTMENT_OTHER): Payer: Self-pay

## 2023-07-29 ENCOUNTER — Other Ambulatory Visit (HOSPITAL_BASED_OUTPATIENT_CLINIC_OR_DEPARTMENT_OTHER): Payer: Self-pay

## 2023-08-01 ENCOUNTER — Other Ambulatory Visit (HOSPITAL_BASED_OUTPATIENT_CLINIC_OR_DEPARTMENT_OTHER): Payer: Self-pay

## 2023-08-01 MED ORDER — MOUNJARO 10 MG/0.5ML ~~LOC~~ SOAJ
10.0000 mg | SUBCUTANEOUS | 3 refills | Status: AC
  Filled 2023-08-01: qty 2, 28d supply, fill #0
  Filled 2023-09-02: qty 2, 28d supply, fill #1

## 2023-08-02 ENCOUNTER — Other Ambulatory Visit (HOSPITAL_BASED_OUTPATIENT_CLINIC_OR_DEPARTMENT_OTHER): Payer: Self-pay

## 2023-09-02 ENCOUNTER — Other Ambulatory Visit (HOSPITAL_BASED_OUTPATIENT_CLINIC_OR_DEPARTMENT_OTHER): Payer: Self-pay

## 2023-09-20 ENCOUNTER — Other Ambulatory Visit (HOSPITAL_BASED_OUTPATIENT_CLINIC_OR_DEPARTMENT_OTHER): Payer: Self-pay

## 2024-04-13 ENCOUNTER — Other Ambulatory Visit: Payer: Self-pay | Admitting: Medical Genetics

## 2024-04-13 DIAGNOSIS — Z006 Encounter for examination for normal comparison and control in clinical research program: Secondary | ICD-10-CM
